# Patient Record
Sex: Female | Born: 2000 | Race: Black or African American | Hispanic: No | Marital: Single | Smoking: Current every day smoker
Health system: Southern US, Community
[De-identification: ages and names within clinical notes are randomized; demographics above are authoritative.]

## PROBLEM LIST (undated history)

## (undated) DIAGNOSIS — Q249 Congenital malformation of heart, unspecified: Secondary | ICD-10-CM

## (undated) DIAGNOSIS — J45909 Unspecified asthma, uncomplicated: Secondary | ICD-10-CM

## (undated) DIAGNOSIS — I4510 Unspecified right bundle-branch block: Secondary | ICD-10-CM

## (undated) DIAGNOSIS — F4010 Social phobia, unspecified: Secondary | ICD-10-CM

## (undated) DIAGNOSIS — E669 Obesity, unspecified: Secondary | ICD-10-CM

## (undated) DIAGNOSIS — I440 Atrioventricular block, first degree: Secondary | ICD-10-CM

## (undated) DIAGNOSIS — F259 Schizoaffective disorder, unspecified: Secondary | ICD-10-CM

## (undated) DIAGNOSIS — R519 Headache, unspecified: Secondary | ICD-10-CM

## (undated) DIAGNOSIS — F410 Panic disorder [episodic paroxysmal anxiety] without agoraphobia: Secondary | ICD-10-CM

## (undated) DIAGNOSIS — F419 Anxiety disorder, unspecified: Secondary | ICD-10-CM

## (undated) DIAGNOSIS — R51 Headache: Secondary | ICD-10-CM

---
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
## 2018-02-04 ENCOUNTER — Other Ambulatory Visit: Payer: Self-pay

## 2018-02-04 ENCOUNTER — Encounter (HOSPITAL_COMMUNITY): Payer: Self-pay

## 2018-02-04 ENCOUNTER — Ambulatory Visit (HOSPITAL_COMMUNITY)
Admission: EM | Admit: 2018-02-04 | Discharge: 2018-02-04 | Disposition: A | Discharge status: Home / Self Care | Payer: Medicaid Other | Attending: Internal Medicine | Admitting: Internal Medicine

## 2018-02-04 DIAGNOSIS — J45909 Unspecified asthma, uncomplicated: Secondary | ICD-10-CM | POA: Diagnosis not present

## 2018-02-04 DIAGNOSIS — F329 Major depressive disorder, single episode, unspecified: Secondary | ICD-10-CM | POA: Diagnosis not present

## 2018-02-04 DIAGNOSIS — Z7951 Long term (current) use of inhaled steroids: Secondary | ICD-10-CM | POA: Diagnosis not present

## 2018-02-04 DIAGNOSIS — Z79899 Other long term (current) drug therapy: Secondary | ICD-10-CM | POA: Diagnosis not present

## 2018-02-04 DIAGNOSIS — I440 Atrioventricular block, first degree: Secondary | ICD-10-CM | POA: Insufficient documentation

## 2018-02-04 DIAGNOSIS — F419 Anxiety disorder, unspecified: Secondary | ICD-10-CM | POA: Insufficient documentation

## 2018-02-04 DIAGNOSIS — Z7984 Long term (current) use of oral hypoglycemic drugs: Secondary | ICD-10-CM | POA: Insufficient documentation

## 2018-02-04 DIAGNOSIS — R102 Pelvic and perineal pain: Secondary | ICD-10-CM | POA: Insufficient documentation

## 2018-02-04 DIAGNOSIS — Z68.41 Body mass index (BMI) pediatric, greater than or equal to 95th percentile for age: Secondary | ICD-10-CM | POA: Insufficient documentation

## 2018-02-04 DIAGNOSIS — R109 Unspecified abdominal pain: Secondary | ICD-10-CM | POA: Diagnosis present

## 2018-02-04 DIAGNOSIS — I4510 Unspecified right bundle-branch block: Secondary | ICD-10-CM | POA: Diagnosis not present

## 2018-02-04 LAB — POCT URINALYSIS DIP (DEVICE)
BILIRUBIN URINE: NEGATIVE
Glucose, UA: NEGATIVE mg/dL
Hgb urine dipstick: NEGATIVE
Ketones, ur: NEGATIVE mg/dL
NITRITE: NEGATIVE
PH: 7 (ref 5.0–8.0)
Protein, ur: NEGATIVE mg/dL
Specific Gravity, Urine: 1.015 (ref 1.005–1.030)
UROBILINOGEN UA: 0.2 mg/dL (ref 0.0–1.0)

## 2018-02-04 LAB — POCT PREGNANCY, URINE: Preg Test, Ur: NEGATIVE

## 2018-02-04 MED ORDER — IBUPROFEN 800 MG PO TABS
ORAL_TABLET | ORAL | Status: AC
  Filled 2018-02-04: qty 1

## 2018-02-04 MED ORDER — IBUPROFEN 800 MG PO TABS: 800.0000 mg | ORAL_TABLET | Freq: Three times a day (TID) | ORAL | 0 refills | Status: DC

## 2018-02-04 MED ORDER — IBUPROFEN 800 MG PO TABS
800.0000 mg | ORAL_TABLET | Freq: Once | ORAL | Status: AC
  Administered 2018-02-04: 800 mg via ORAL

## 2018-02-04 NOTE — Discharge Instructions (Signed)
Continue with ibuprofen every 8 hours as needed for pain.  Drink plenty of fluids and ensure bowel movements are regular.  I have sent your urine as well as vaginal swab to be cultured.  Will notify you of any positive findings and if any changes to treatment are needed.   Please follow up with your primary care provider and/or gynecologist for recheck next week as you may need an ultrasound of the pelvis.  If worsening of pain, fevers, diarrhea, vomiting, or otherwise worsening please return or go to the Er.

## 2018-02-04 NOTE — ED Provider Notes (Signed)
[facility]    CSN: [id number] Arrival date & time: 02/04/18  1411     History   Chief Complaint Chief Complaint  Patient presents with  . Abdominal Pain    HPI Diane Mann is a 17 y.o. female.   Diane Mann presents with complaints of pelvic pain which has been ongoing for months but has worsened recently. Sometimes eating or standing too long can worsen it, otherwise no specific triggers. Comes and goes. No nausea or vomiting. Denies diarrhea or constipation, had a normal BM today. No fevers. LMP 7/1, periods are irregular, she is on birth control for this. She is not sexually active. Denies vaginal itching, burning or discharge. No urinary symptoms. Occasional low back pain. Has intermittently taking ibuprofen which helps for a short amount of time, last was two days ago. Hx of anxiety, asthma, headache, oligomenorrhea.     ROS per HPI.      Past Medical History:  Diagnosis Date  . Anxiety   . Asthma   . Asthma 04/24/2015  . 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 Congenital heart problem    blockage  . Headache   . Obesity   . Panic attacks 04/24/2015  . RBBB (right bundle branch block) 04/24/2015  . Social anxiety disorder 04/24/2015    Patient Active Problem List   Diagnosis Date Noted  . 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 Social anxiety disorder 04/24/2015  . Panic attacks 04/24/2015  . RBBB (right bundle branch block) 04/24/2015  . 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 Asthma 04/24/2015  . Oligomenorrhea 04/09/2015  . Thyroid function test abnormal 08/05/2014  . 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 Obesity 08/05/2014  . Short stature 08/05/2014    Past Surgical History:  Procedure Laterality Date  . MOUTH SURGERY  05/04/2017   Wisdom teeth ectraction Bilateral     OB History    None      Home Medications    Prior to Admission medications   Medication Sig Start Date End Date Taking? Authorizing Provider  albuterol (PROVENTIL HFA;VENTOLIN HFA) 108 (90 Base) MCG/ACT inhaler Inhale into the lungs every 6 (six) hours as needed for wheezing or shortness of breath.   Yes [provider]  metFORMIN (GLUCOPHAGE-XR) 500 MG 24 hr tablet Take 1 tablet (500 mg total) by mouth daily with breakfast. 08/01/17  Yes Dessa PhiBadik, Jennifer, MD  norethindrone-ethinyl estradiol-iron (MICROGESTIN FE 1.5/30) 1.5-30 MG-MCG tablet Take 1 tablet by mouth daily. Skip placebo pills x 2 months. On the third month- take the placebo (brown) pills. 03/30/17  Yes Dessa PhiBadik, Jennifer, MD  brompheniramine-pseudoephedrine-DM 30-2-10 MG/5ML syrup Take 5 mLs by mouth 4 (four) times daily as needed. 12/24/17   Wieters, Hallie C, PA-C  Cetirizine HCl 10 MG CAPS Take 1 capsule (10 mg total) by mouth daily for 10 days. 12/24/17 01/03/18  Wieters, Hallie C, PA-C  fluticasone (FLONASE) 50 MCG/ACT nasal spray Place 1-2 sprays into both nostrils daily for 7 days. 12/24/17 12/31/17  Wieters, Hallie C, PA-C  ibuprofen (ADVIL,MOTRIN) 800 MG tablet Take 1 tablet (800 mg total) by mouth 3 (three) times daily. 02/04/18   Georgetta HaberBurky, Jaydeen Darley B, NP    Family History Family History  Problem Relation Age of Onset  . Lupus Mother   . Heart disease Father     Social History Social History  Tobacco Use  . Smoking status: Never Smoker  . Smokeless tobacco: Never Used  Substance Use Topics  . Alcohol use: No  . Drug use: No     Allergies   Patient has no known allergies.   Review of Systems Review of Systems   Physical Exam Triage Vital Signs ED Triage Vitals  Enc Vitals Group     BP 02/04/18 1520 128/62     Pulse Rate 02/04/18 1520 88     Resp 02/04/18 1520 16     Temp 02/04/18 1520 98.6 F (37 C)     Temp Source 02/04/18 1520 Oral     SpO2 02/04/18 1520 98 %     Weight --      Height --      Head  Circumference --      Peak Flow --      Pain Score 02/04/18 1521 10     Pain Loc --      Pain Edu? --      Excl. in GC? --    No data found.  Updated Vital Signs BP 128/62 (BP Location: Left Arm)   Pulse 88   Temp 98.6 F (37 C) (Oral)   Resp 16   SpO2 98%   Visual Acuity Right Eye Distance:   Left Eye Distance:   Bilateral Distance:    Right Eye Near:   Left Eye Near:    Bilateral Near:     Physical Exam  Constitutional: She is oriented to person, place, and time. She appears well-developed and well-nourished. No distress.  Cardiovascular: Normal rate, regular rhythm and normal heart sounds.  Pulmonary/Chest: Effort normal and breath sounds normal.  Abdominal: Soft. Bowel sounds are normal. There is no hepatosplenomegaly, splenomegaly or hepatomegaly. There is tenderness in the right lower quadrant, suprapubic area and left lower quadrant. There is no rigidity, no rebound, no guarding, no CVA tenderness, no tenderness at McBurney's point and negative Murphy's sign. No hernia. Hernia confirmed negative in the right inguinal area and confirmed negative in the left inguinal area.  Genitourinary: There is no rash or lesion on the right labia. There is no rash or lesion on the left labia. Cervix exhibits no motion tenderness. Right adnexum displays tenderness. Left adnexum displays tenderness. Vaginal discharge found.  Genitourinary Comments: Scant thick white vaginal discharge noted; generally tender at midline and adnexa bilaterally with bimanual exam; no CMT; no bleeding; no masses or enlargement palpated   Lymphadenopathy: No inguinal adenopathy noted on the right or left side.  Neurological: She is alert and oriented to person, place, and time.  Skin: Skin is warm and dry.     UC Treatments / Results  Labs (all labs ordered are listed, but only abnormal results are displayed) Labs Reviewed  POCT URINALYSIS DIP (DEVICE) - Abnormal; Notable for the following components:       Result Value   Leukocytes, UA SMALL (*)    All other components within normal limits  URINE CULTURE  POCT PREGNANCY, URINE  CERVICOVAGINAL ANCILLARY ONLY    EKG None  Radiology No results found.  Procedures Procedures (including critical care time)  Medications Ordered in UC Medications  ibuprofen (ADVIL,MOTRIN) tablet 800 mg (has no administration in time range)    Initial Impression / Assessment and Plan / UC Course  I have reviewed the triage vital signs and the nursing notes.  Pertinent labs & imaging results that were available during my care of the patient were reviewed by me  and considered in my medical decision making (see chart for details).     Non toxic today. Without red flag findings for emergent evaluation but certainly feel that likely a pelvic ultrasound will be needed for further evaluation of her symptoms which have been ongoing for months. Eating and drinking, no fevers. Urine culture pending. Vaginal cytology pending. Return precautions provided. Follow up next week with PCP or gyne. Patient verbalized understanding and agreeable to plan.    Final Clinical Impressions(s) / UC Diagnoses   Final diagnoses:  Pelvic pain in female     Discharge Instructions     Continue with ibuprofen every 8 hours as needed for pain.  Drink plenty of fluids and ensure bowel movements are regular.  I have sent your urine as well as vaginal swab to be cultured.  Will notify you of any positive findings and if any changes to treatment are needed.   Please follow up with your primary care provider and/or gynecologist for recheck next week as you may need an ultrasound of the pelvis.  If worsening of pain, fevers, diarrhea, vomiting, or otherwise worsening please return or go to the Er.    ED Prescriptions    Medication Sig Dispense Auth. Provider   ibuprofen (ADVIL,MOTRIN) 800 MG tablet Take 1 tablet (800 mg total) by mouth 3 (three) times daily. 21 tablet Georgetta Haber, NP     Controlled Substance Prescriptions [employer] Controlled Substance Registry consulted? Not Applicable   Georgetta Haber, NP 02/04/18 [phone]

## 2018-02-04 NOTE — ED Triage Notes (Signed)
Pt presents today with lower abdominal pain that has been going on for a while. States it got worse on 8/8 to the point where she could not walk. States she has urinary frequency.

## 2018-02-05 LAB — URINE CULTURE

## 2018-02-06 LAB — CERVICOVAGINAL ANCILLARY ONLY
BACTERIAL VAGINITIS: NEGATIVE
Candida vaginitis: POSITIVE — AB
Chlamydia: NEGATIVE
NEISSERIA GONORRHEA: NEGATIVE
Trichomonas: NEGATIVE

## 2018-02-07 ENCOUNTER — Telehealth (HOSPITAL_COMMUNITY): Payer: Self-pay

## 2018-02-07 MED ORDER — FLUCONAZOLE 150 MG PO TABS: 150.0000 mg | ORAL_TABLET | Freq: Every day | ORAL | 0 refills | Status: AC

## 2018-02-07 NOTE — Telephone Encounter (Signed)
Candida (yeast) was positive.  Prescription for fluconazole 150mg  po now, repeat dose in 3d if needed, #2 no refills, sent to the pharmacy of record.  Recheck or followup with PCP for further evaluation if symptoms are not improving.  Attempted to reach patient. No answer at this time.

## 2018-03-06 ENCOUNTER — Ambulatory Visit (INDEPENDENT_AMBULATORY_CARE_PROVIDER_SITE_OTHER): Payer: Self-pay | Admitting: Pediatric Endocrinology

## 2018-03-06 ENCOUNTER — Encounter (INDEPENDENT_AMBULATORY_CARE_PROVIDER_SITE_OTHER): Payer: Self-pay | Admitting: Pediatric Endocrinology

## 2018-03-06 VITALS — BP 112/74 | HR 70 | Ht 61.26 in | Wt 193.6 lb

## 2018-03-06 DIAGNOSIS — Z6836 Body mass index (BMI) 36.0-36.9, adult: Secondary | ICD-10-CM

## 2018-03-06 DIAGNOSIS — N913 Primary oligomenorrhea: Secondary | ICD-10-CM

## 2018-03-06 DIAGNOSIS — E6609 Other obesity due to excess calories: Secondary | ICD-10-CM

## 2018-03-06 NOTE — Progress Notes (Signed)
Subjective:  Subjective  Patient Name: Diane Mann Date of Birth: 09/20/2000  MRN: [id number]  Diane Mann  presents to the office today for follow up evaluation and management of her oligomenorrhea, insulin resistance, and pediatric obesity.  HISTORY OF PRESENT ILLNESS:   Diane Mann is a 17 y.o. AA female   Diane Mann was accompanied by her self   1. Diane Mann was seen by her PCP in June of 2015. At that visit they obtained screening labs and were concerned about a low free t4 with a normal TSH. They also were concerned about a possible diagnosis of Turner's syndrome. They referred her to endocrinology for further evaluation and management.    2. [name] was last seen in clinic on 11/03/17. She has been generally healthy.    She says that her knee is better. She has been sick the last 2 days. She didn't go to work today. She was seen by her PCP for lower pelvic pain. It is still hurting.   Her last period was in August. She stopped taking her OCP in July and had a period in August "naturally". She thought that it was really weird. She hasn't gone back on her pills yet- she wants to. She is thinking about starting this month.   She is drinking water. She sometimes has some sweet tea or Dr. Reino Kent.   She is not sure how many jumping jacks she can do. She hasn't been doing them often. She was up to 50.   She continues on metformin once a day.   She usually eats about once a day. She thinks that the Metformin makes her not want to eat as much. She sometimes eats twice a day.   3. Pertinent Review of Systems:  Constitutional: The patient feels "okay I guess". The patient seems healthy and active. She is feeling a little under the weather Eyes: Vision seems to be good. There are no recognized eye problems. Wears glasses  Neck: The patient has no complaints of anterior neck swelling, soreness, tenderness, pressure, discomfort, or difficulty swallowing.   Heart: Heart rate increases with  exercise or other physical activity. The patient has no complaints of palpitations, irregular heart beats, chest pain, or chest pressure.   Lungs: no asthma or wheezing.  Gastrointestinal: Bowel movents seem normal. The patient has no complaints of excessive hunger, acid reflux, upset stomach, stomach aches or pains, diarrhea, or constipation.  Legs: Muscle mass and strength seem normal. There are no complaints of numbness, tingling, burning, or pain. No edema is noted.  Feet: There are no obvious foot problems. There are no complaints of numbness, tingling, burning, or pain. No edema is noted. Neurologic: There are no recognized problems with muscle movement and strength, sensation, or coordination. GYN/GU: per HPI. LMP Aug 2019 Skin: no eczema, birth marks, or rashes.   PAST MEDICAL, FAMILY, AND SOCIAL HISTORY  Past Medical History:  Diagnosis Date  . Anxiety   . Asthma   . Asthma 04/24/2015  . 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 Congenital heart problem    blockage  . Headache   . Obesity   . Panic attacks 04/24/2015  . RBBB (right bundle branch block) 04/24/2015  . Social anxiety disorder 04/24/2015    Family History  Problem Relation Age of Onset  . Lupus Mother   . Heart disease Father      Current Outpatient Medications:  .  albuterol (PROVENTIL HFA;VENTOLIN HFA) 108 (90 Base) MCG/ACT inhaler, Inhale into the lungs every 6 (  six) hours as needed for wheezing or shortness of breath., Disp: , Rfl:  .  metFORMIN (GLUCOPHAGE-XR) 500 MG 24 hr tablet, Take 1 tablet (500 mg total) by mouth daily with breakfast., Disp: 30 tablet, Rfl: 11 .  norethindrone-ethinyl estradiol-iron (MICROGESTIN FE 1.5/30) 1.5-30 MG-MCG tablet, Take 1 tablet by mouth daily. Skip placebo pills x 2 months. On the third month- take the placebo (brown) pills., Disp: 84 tablet, Rfl: 11 .  brompheniramine-pseudoephedrine-DM 30-2-10 MG/5ML syrup, Take 5 mLs by mouth 4 (four) times daily as needed. (Patient not  taking: Reported on 03/06/2018), Disp: 120 mL, Rfl: 0 .  Cetirizine HCl 10 MG CAPS, Take 1 capsule (10 mg total) by mouth daily for 10 days., Disp: 10 capsule, Rfl: 0 .  fluticasone (FLONASE) 50 MCG/ACT nasal spray, Place 1-2 sprays into both nostrils daily for 7 days., Disp: 1 g, Rfl: 0 .  ibuprofen (ADVIL,MOTRIN) 800 MG tablet, Take 1 tablet (800 mg total) by mouth 3 (three) times daily., Disp: 21 tablet, Rfl: 0  Allergies as of 03/06/2018  . (No Known Allergies)     reports that she has never smoked. She has never used smokeless tobacco. She reports that she does not drink alcohol or use drugs. Pediatric History  Patient Guardian Status  . Mother:  Jennika, [place]   Other Topics Concern  . Not on file  Social History Narrative   Diane Mann lives with her mom and stepfather, 46 years old brother.  Mom works in housekeeping.  Diane Mann graduated from [employer]. Works at [employer] on [employer]     1. School and Family: Graduated   Lives with mom and brother Wants to study photography at [employer]. Working at [employer]   2. Activities: not active. Doing photography at church 3. Primary Care Provider: Maree Erie, MD  ROS: There are no other significant problems involving Diane Mann's other body systems.    Objective:  Objective  Vital Signs:  BP 112/74   Pulse 70   Ht 5' 1.26" (1.556 m)   Wt 193 lb 9.6 oz (87.8 kg)   BMI 36.27 kg/m   Blood pressure percentiles are not available for patients who are 18 years or older.   Ht Readings from Last 3 Encounters:  03/06/18 5' 1.26" (1.556 m) (12 %, Z= -1.18)*  11/03/17 5' 0.71" (1.542 m) (8 %, Z= -1.39)*  10/09/17 5\' 1"  (1.549 m) (10 %, Z= -1.28)*   * Growth percentiles are based on CDC (Girls, 2-20 Years) data.   Wt Readings from Last 3 Encounters:  03/06/18 193 lb 9.6 oz (87.8 kg) (97 %, Z= 1.86)*  11/03/17 196 lb 9.6 oz (89.2 kg) (97 %, Z= 1.91)*  10/09/17 202 lb (91.6 kg) (98 %, Z= 1.99)*   * Growth percentiles  are based on CDC (Girls, 2-20 Years) data.   HC Readings from Last 3 Encounters:  No data found for [facility]   Body surface area is 1.95 meters squared. 12 %ile (Z= -1.18) based on CDC (Girls, 2-20 Years) Stature-for-age data based on Stature recorded on 03/06/2018. 97 %ile (Z= 1.86) based on CDC (Girls, 2-20 Years) weight-for-age data using vitals from 03/06/2018.   PHYSICAL EXAM:  Constitutional: The patient appears healthy and well nourished. The patient's height and weight are consistent with obesity for age. She is alert and interactive today. She has lost 3 pounds since her last visit.   Head: The head is normocephalic. Face: The face appears normal. 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  Eyes: The eyes appear to be normally formed and spaced. Gaze is conjugate. There is no obvious arcus or proptosis. Moisture appears normal. Ears: The ears are normally placed and appear externally normal. Mouth: The oropharynx and tongue appear normal. Dentition appears to be normal for age. Oral moisture is normal. Neck: The neck appears to be visibly normal. The thyroid gland is 15 grams in size. The consistency of the thyroid gland is normal. The thyroid gland is not tender to palpation. +1 acanthosis Lungs: The lungs are clear to auscultation. Air movement is good. Heart: Heart rate and rhythm are regular. Heart sounds S1 and S2 are normal. I did not appreciate any pathologic cardiac murmurs. Abdomen: The abdomen appears to be enlarged in size for the patient's age. +stretch marks.  Bowel sounds are normal. There is no obvious hepatomegaly, splenomegaly, or other mass effect.  Trunk is long compared with legs.  Arms: Muscle size and bulk are normal for age. Hands: There is no obvious tremor. Phalangeal and metacarpophalangeal joints are normal. Palmar muscles are normal for age. Palmar skin is normal. Palmar moisture is also normal. Hands are big compared to body.  Legs: Muscles appear normal for age. No edema  is present. Feet: Feet are normally formed. Dorsalis pedal pulses are normal. Neurologic: Strength is normal for age in both the upper and lower extremities. Muscle tone is normal. Sensation to touch is normal in both the legs and feet.   Puberty: Tanner stage pubic hair: V Tanner stage breast/genital V.   LAB DATA:   none  Last A1C 5.3     Assessment and Plan:  Assessment  ASSESSMENT:  Diane Mann is a 17 y.o. AA female with oligomenorrhea and pediatric obesity.   Pediatric Obesity - Has lost ~20 pounds in the past year.  - Has lost 3 pounds since last visit - Taking Metformin 500 mg ER daily with food. She feels that this curbs her appetite - A1C was markedly improved at last visit. Not checked today.   Oligomenorrhea - Has been doing continuous cycling for 3 months at a time on Junel - did not take her OCP this past month and did have a spontaneous cycle - is confused about when to restart pills  PLAN:   1. Diagnostic: A1C at next visit 2. Therapeutic: conitnue  generic Junel fe. Will do continuous cycling with menses q 3 months. Start Sunday after next cycle. Continue Metformin ER 500 mg once daily.  3. Patient education: All of the above discussed with patient. Questions answered.  4. Follow-up: Return in about 4 months (around 07/06/2018).      Dessa Phi, MD  Level of Service: This visit lasted in excess of 25 minutes. More than 50% of the visit was devoted to counseling.

## 2018-03-06 NOTE — Patient Instructions (Addendum)
Continue Metformin 500 ER mg once a day.   Continue Junel for 3 months at a time. Restart the Sunday after your next period. If you do not have a period in September- please call.   Your PCP is the [facility]. You should schedule a follow up there.

## 2018-05-09 ENCOUNTER — Emergency Department (HOSPITAL_COMMUNITY): Payer: No Typology Code available for payment source

## 2018-05-09 ENCOUNTER — Encounter (HOSPITAL_COMMUNITY): Payer: Self-pay | Admitting: Emergency Medicine

## 2018-05-09 ENCOUNTER — Other Ambulatory Visit: Payer: Self-pay

## 2018-05-09 ENCOUNTER — Emergency Department (HOSPITAL_COMMUNITY)
Admission: EM | Admit: 2018-05-09 | Discharge: 2018-05-09 | Disposition: A | Discharge status: Home / Self Care | Payer: No Typology Code available for payment source | Attending: Emergency Medicine | Admitting: Emergency Medicine

## 2018-05-09 DIAGNOSIS — Y990 Civilian activity done for income or pay: Secondary | ICD-10-CM | POA: Insufficient documentation

## 2018-05-09 DIAGNOSIS — Y939 Activity, unspecified: Secondary | ICD-10-CM | POA: Insufficient documentation

## 2018-05-09 DIAGNOSIS — W010XXA Fall on same level from slipping, tripping and stumbling without subsequent striking against object, initial encounter: Secondary | ICD-10-CM | POA: Insufficient documentation

## 2018-05-09 DIAGNOSIS — S8002XA Contusion of left knee, initial encounter: Secondary | ICD-10-CM | POA: Insufficient documentation

## 2018-05-09 DIAGNOSIS — Y929 Unspecified place or not applicable: Secondary | ICD-10-CM | POA: Insufficient documentation

## 2018-05-09 DIAGNOSIS — J45909 Unspecified asthma, uncomplicated: Secondary | ICD-10-CM | POA: Diagnosis not present

## 2018-05-09 DIAGNOSIS — Z79899 Other long term (current) drug therapy: Secondary | ICD-10-CM | POA: Insufficient documentation

## 2018-05-09 MED ORDER — DICLOFENAC SODIUM 1 % TD GEL: 2.0000 g | Freq: Four times a day (QID) | TRANSDERMAL | 0 refills | Status: DC

## 2018-05-09 MED ORDER — ACETAMINOPHEN 325 MG PO TABS
650.0000 mg | ORAL_TABLET | Freq: Once | ORAL | Status: AC
  Administered 2018-05-09: 650 mg via ORAL
  Filled 2018-05-09: qty 2

## 2018-05-09 NOTE — Discharge Instructions (Addendum)
Your x-ray did not have any broken bones. Your symptoms are most likely due to a contusion which is a painful bruise. Take Tylenol and use the Voltaren gel as prescribed to help with pain. Follow the instructions regarding rice therapy. Return to ED if there are additional injuries or falls, worsening swelling, redness of your joint.

## 2018-05-09 NOTE — ED Triage Notes (Signed)
Pt reports she slipped and fell at work today, landed on her L knee. Pt reports pain to L knee. No obvious injury noted.

## 2018-05-09 NOTE — ED Provider Notes (Signed)
[facility] EMERGENCY DEPARTMENT Provider Note   CSN: [id number] Arrival date & time: 05/09/18  4098     History   Chief Complaint Chief Complaint  Patient presents with  . Knee Pain    HPI Diane Mann is a 17 y.o. female who presents to ED for evaluation of left knee pain for the past 1 hour.  States that she slipped on some ice at work and landed directly on her left knee.  She is ambulated with pain since then.  She has a history of a "sprain" in his knee in the past after she injured it similarly.  Denies any prior fracture, dislocation or procedures in the area.  She did not take any medication help with pain.  Denies any other injuries.  HPI  Past Medical History:  Diagnosis Date  . Anxiety   . Asthma   . Asthma 04/24/2015  . 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 Congenital heart problem    blockage  . Headache   . Obesity   . Panic attacks 04/24/2015  . RBBB (right bundle branch block) 04/24/2015  . Social anxiety disorder 04/24/2015    Patient Active Problem List   Diagnosis Date Noted  . 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 Social anxiety disorder 04/24/2015  . Panic attacks 04/24/2015  . RBBB (right bundle branch block) 04/24/2015  . 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 Asthma 04/24/2015  . Oligomenorrhea 04/09/2015  . Thyroid function test abnormal 08/05/2014  . 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 Obesity 08/05/2014  . Short stature 08/05/2014    Past Surgical History:  Procedure Laterality Date  . MOUTH SURGERY  05/04/2017   Wisdom teeth ectraction Bilateral      OB History   None      Home Medications    Prior to Admission medications   Medication Sig Start Date End Date Taking? Authorizing Provider  albuterol (PROVENTIL HFA;VENTOLIN HFA) 108 (90 Base) MCG/ACT  inhaler Inhale into the lungs every 6 (six) hours as needed for wheezing or shortness of breath.    [provider]  brompheniramine-pseudoephedrine-DM 30-2-10 MG/5ML syrup Take 5 mLs by mouth 4 (four) times daily as needed. Patient not taking: Reported on 03/06/2018 12/24/17   Wieters, Fran Lowes C, PA-C  Cetirizine HCl 10 MG CAPS Take 1 capsule (10 mg total) by mouth daily for 10 days. 12/24/17 01/03/18  Wieters, Hallie C, PA-C  diclofenac sodium (VOLTAREN) 1 % GEL Apply 2 g topically 4 (four) times daily. 05/09/18   Vila Dory, PA-C  fluticasone (FLONASE) 50 MCG/ACT nasal spray Place 1-2 sprays into both nostrils daily for 7 days. 12/24/17 12/31/17  Wieters, Hallie C, PA-C  ibuprofen (ADVIL,MOTRIN) 800 MG tablet Take 1 tablet (800 mg total) by mouth 3 (three) times daily. 02/04/18   Georgetta Haber, NP  metFORMIN (GLUCOPHAGE-XR) 500 MG 24 hr tablet Take 1 tablet (500 mg total) by mouth daily with breakfast. 08/01/17   Dessa Phi, MD  norethindrone-ethinyl estradiol-iron (MICROGESTIN FE 1.5/30) 1.5-30 MG-MCG tablet Take 1 tablet by mouth daily. Skip placebo pills x 2 months. On the third month- take the placebo (brown) pills. 03/30/17   Dessa Phi, MD    Family History Family History  Problem Relation Age of Onset  . Lupus Mother   . Heart disease Father     Social  History Social History   Tobacco Use  . Smoking status: Never Smoker  . Smokeless tobacco: Never Used  Substance Use Topics  . Alcohol use: No  . Drug use: No     Allergies   Patient has no known allergies.   Review of Systems Review of Systems  Constitutional: Negative for chills and fever.  Musculoskeletal: Positive for arthralgias. Negative for joint swelling.  Skin: Negative for wound.     Physical Exam Updated Vital Signs BP 128/80   Pulse 91   Temp 99.2 F (37.3 C) (Oral)   Resp 16   LMP 03/22/2017   SpO2 95%   Physical Exam  Constitutional: She appears well-developed and well-nourished. No  distress.  HENT:  Head: Normocephalic and atraumatic.  Eyes: Conjunctivae and EOM are normal. No scleral icterus.  Neck: Normal range of motion.  Pulmonary/Chest: Effort normal. No respiratory distress.  Musculoskeletal: She exhibits tenderness.  Diffuse tenderness to palpation of the left anterior knee.  No deformity, overlying skin changes noted.  Able to flex and extend knee although reports pain with this.  2+ DP pulse palpated bilaterally.  No calf tenderness noted.  Neurological: She is alert.  Skin: No rash noted. She is not diaphoretic.  Psychiatric: She has a normal mood and affect.  Nursing note and vitals reviewed.    ED Treatments / Results  Labs (all labs ordered are listed, but only abnormal results are displayed) Labs Reviewed - No data to display  EKG None  Radiology Dg Knee Complete 4 Views Left  Result Date: 05/09/2018 CLINICAL DATA:  Patient fell at work hurting her knee. EXAM: LEFT KNEE - COMPLETE 4+ VIEW COMPARISON:  10/09/2017 FINDINGS: No evidence of fracture, dislocation, or joint effusion. No evidence of arthropathy or other focal bone abnormality. Soft tissues are unremarkable. IMPRESSION: Negative. Electronically Signed   By: Tollie Eth M.D.   On: 05/09/2018 19:21    Procedures Procedures (including critical care time)  Medications Ordered in ED Medications  acetaminophen (TYLENOL) tablet 650 mg (650 mg Oral Given 05/09/18 1849)     Initial Impression / Assessment and Plan / ED Course  I have reviewed the triage vital signs and the nursing notes.  Pertinent labs & imaging results that were available during my care of the patient were reviewed by me and considered in my medical decision making (see chart for details).     17 year old female presents to ED for left knee pain.  She slipped on ice at work and fell directly onto her knee.  No changes to range of motion or overlying skin changes or wounds noted on my exam.  Area is neurovascularly  intact.  X-rays negative.  Suspect that symptoms are due to contusion.  Doubt infectious or vascular cause of symptoms will advise Voltaren gel, rice therapy and advised to return to ED for any severe worsening symptoms.  Patient is hemodynamically stable, in NAD, and able to ambulate in the ED. Evaluation does not show pathology that would require ongoing emergent intervention or inpatient treatment. I explained the diagnosis to the patient. Pain has been managed and has no complaints prior to discharge. Patient is comfortable with above plan and is stable for discharge at this time. All questions were answered prior to disposition. Strict return precautions for returning to the ED were discussed. Encouraged follow up with PCP.    Portions of this note were generated with Scientist, clinical (histocompatibility and immunogenetics). Dictation errors may occur despite best attempts at proofreading.  Final Clinical Impressions(s) / ED Diagnoses   Final diagnoses:  Contusion of left knee, initial encounter    ED Discharge Orders         Ordered    diclofenac sodium (VOLTAREN) 1 % GEL  4 times daily     05/09/18 1928           Dietrich Pates, PA-C 05/09/18 1931    Doug Sou, MD 05/10/18 [phone]

## 2018-05-09 NOTE — ED Notes (Signed)
Ace wrap placed on pts knee.

## 2018-05-18 ENCOUNTER — Encounter (HOSPITAL_COMMUNITY): Payer: Self-pay | Admitting: Emergency Medicine

## 2018-05-18 ENCOUNTER — Ambulatory Visit (HOSPITAL_COMMUNITY)
Admission: EM | Admit: 2018-05-18 | Discharge: 2018-05-18 | Disposition: A | Discharge status: Home / Self Care | Payer: No Typology Code available for payment source | Attending: Family Medicine | Admitting: Family Medicine

## 2018-05-18 DIAGNOSIS — M25562 Pain in left knee: Secondary | ICD-10-CM | POA: Diagnosis not present

## 2018-05-18 MED ORDER — NAPROXEN 500 MG PO TABS: 500.0000 mg | ORAL_TABLET | Freq: Two times a day (BID) | ORAL | 0 refills | Status: DC

## 2018-05-18 NOTE — ED Triage Notes (Signed)
Pt c/o ongoing L knee pain, pt had xray done in ER which was normal, pt got herself a knee brace which states isn't helping very much.

## 2018-05-18 NOTE — Discharge Instructions (Addendum)
Please follow-up with orthopedics for further evaluation of knee Continue ice and elevation Naprosyn twice daily

## 2018-05-19 NOTE — ED Provider Notes (Signed)
[facility]    CSN: [id number] Arrival date & time: 05/18/18  1820     History   Chief Complaint Chief Complaint  Patient presents with  . Knee Pain    HPI Diane Mann is a 17 y.o. female history of asthma, anxiety, presenting today for evaluation of left knee pain.  Patient states that approximately 1 week and 2 days ago she slipped and fell at work.  Patient works at [employer], landed directly onto her knee.  She was evaluated in the emergency room afterward.  X-rays were negative for bony abnormality.  She was sent home and recommended rest ice elevation and anti-inflammatories.  She has been doing this without relief.  She feels as if her pain is worsened.  She bought an over-the-counter knee brace which is not provided help either.  She does note to have occasional popping sensations and sensation of instability.   HPI  Past Medical History:  Diagnosis Date  . Anxiety   . Asthma   . Asthma 04/24/2015  . 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 Congenital heart problem    blockage  . Headache   . Obesity   . Panic attacks 04/24/2015  . RBBB (right bundle branch block) 04/24/2015  . Social anxiety disorder 04/24/2015    Patient Active Problem List   Diagnosis Date Noted  . 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 Social anxiety disorder 04/24/2015  . Panic attacks 04/24/2015  . RBBB (right bundle branch block) 04/24/2015  . 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 Asthma 04/24/2015  . Oligomenorrhea 04/09/2015  . Thyroid function test abnormal 08/05/2014  . 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 Obesity 08/05/2014  . Short stature 08/05/2014    Past Surgical History:  Procedure Laterality Date  . MOUTH SURGERY  05/04/2017   Wisdom teeth ectraction Bilateral     OB History   None      Home  Medications    Prior to Admission medications   Medication Sig Start Date End Date Taking? Authorizing Provider  albuterol (PROVENTIL HFA;VENTOLIN HFA) 108 (90 Base) MCG/ACT inhaler Inhale into the lungs every 6 (six) hours as needed for wheezing or shortness of breath.    [provider]  Cetirizine HCl 10 MG CAPS Take 1 capsule (10 mg total) by mouth daily for 10 days. 12/24/17 01/03/18  Wieters, Hallie C, PA-C  diclofenac sodium (VOLTAREN) 1 % GEL Apply 2 g topically 4 (four) times daily. 05/09/18   Khatri, Hina, PA-C  fluticasone (FLONASE) 50 MCG/ACT nasal spray Place 1-2 sprays into both nostrils daily for 7 days. 12/24/17 12/31/17  Wieters, Hallie C, PA-C  ibuprofen (ADVIL,MOTRIN) 800 MG tablet Take 1 tablet (800 mg total) by mouth 3 (three) times daily. 02/04/18   Georgetta HaberBurky, Natalie B, NP  naproxen (NAPROSYN) 500 MG tablet Take 1 tablet (500 mg total) by mouth 2 (two) times daily. 05/18/18   Wieters, Hallie C, PA-C  norethindrone-ethinyl estradiol-iron (MICROGESTIN FE 1.5/30) 1.5-30 MG-MCG tablet Take 1 tablet by mouth daily. Skip placebo pills x 2 months. On the third month- take the placebo (brown) pills. 03/30/17   Dessa PhiBadik, Jennifer, MD    Family History Family History  Problem Relation Age of Onset  . Lupus Mother   . Heart disease Father     Social History Social History   Tobacco Use  .  Smoking status: Never Smoker  . Smokeless tobacco: Never Used  Substance Use Topics  . Alcohol use: No  . Drug use: No     Allergies   Patient has no known allergies.   Review of Systems Review of Systems  Constitutional: Negative for fatigue and fever.  Eyes: Negative for visual disturbance.  Respiratory: Negative for shortness of breath.   Cardiovascular: Negative for chest pain.  Gastrointestinal: Negative for abdominal pain, nausea and vomiting.  Musculoskeletal: Positive for arthralgias and joint swelling.  Skin: Positive for color change. Negative for rash and wound.    Neurological: Negative for dizziness, weakness, light-headedness and headaches.     Physical Exam Triage Vital Signs ED Triage Vitals  Enc Vitals Group     BP 05/18/18 1909 123/76     Pulse Rate 05/18/18 1909 84     Resp 05/18/18 1909 16     Temp 05/18/18 1909 98.4 F (36.9 C)     Temp src --      SpO2 05/18/18 1909 100 %     Weight --      Height --      Head Circumference --      Peak Flow --      Pain Score 05/18/18 1911 9     Pain Loc --      Pain Edu? --      Excl. in GC? --    No data found.  Updated Vital Signs BP 123/76   Pulse 84   Temp 98.4 F (36.9 C)   Resp 16   SpO2 100%   Visual Acuity Right Eye Distance:   Left Eye Distance:   Bilateral Distance:    Right Eye Near:   Left Eye Near:    Bilateral Near:     Physical Exam  Constitutional: She is oriented to person, place, and time. She appears well-developed and well-nourished.  No acute distress  HENT:  Head: Normocephalic and atraumatic.  Nose: Nose normal.  Eyes: Conjunctivae are normal.  Neck: Neck supple.  Cardiovascular: Normal rate.  Pulmonary/Chest: Effort normal. No respiratory distress.  Abdominal: She exhibits no distension.  Musculoskeletal: Normal range of motion.  Left knee:, Slight discoloration of bruising/erythema to anterior/inferior aspect of knee, tender to palpation overlying this area as well as medial and lateral joint line, full active range of motion, resisting full extension, but is able to fully extend, no varus or valgus laxity appreciated, negative Lachman's.  Negative McMurray's.  Neurological: She is alert and oriented to person, place, and time.  Skin: Skin is warm and dry.  Psychiatric: She has a normal mood and affect.  Nursing note and vitals reviewed.    UC Treatments / Results  Labs (all labs ordered are listed, but only abnormal results are displayed) Labs Reviewed - No data to display  EKG None  Radiology No results  found.  Procedures Procedures (including critical care time)  Medications Ordered in UC Medications - No data to display  Initial Impression / Assessment and Plan / UC Course  I have reviewed the triage vital signs and the nursing notes.  Pertinent labs & imaging results that were available during my care of the patient were reviewed by me and considered in my medical decision making (see chart for details).     Patient with persistent knee pain since injury.  No new injury since.  Do not recommend further imaging today.  Discussed possibility of other injuries including meniscal versus ligamentous.  Recommended patient  continuing conservative treatment of ice, elevation, provided alternative knee brace.  Provided patient with options and opted for a knee wrap with metal hinges on the side.  Advised to follow-up with orthopedics if symptoms still persisting and not improving with another week of conservative treatment.Discussed strict return precautions. Patient verbalized understanding and is agreeable with plan.  Final Clinical Impressions(s) / UC Diagnoses   Final diagnoses:  Acute pain of left knee     Discharge Instructions     Please follow-up with orthopedics for further evaluation of knee Continue ice and elevation Naprosyn twice daily   ED Prescriptions    Medication Sig Dispense Auth. Provider   naproxen (NAPROSYN) 500 MG tablet Take 1 tablet (500 mg total) by mouth 2 (two) times daily. 30 tablet Wieters, Pocahontas C, PA-C     Controlled Substance Prescriptions [place] Controlled Substance Registry consulted? Not Applicable   Lew Dawes, [place] 05/19/18 [phone]

## 2018-07-10 ENCOUNTER — Encounter (INDEPENDENT_AMBULATORY_CARE_PROVIDER_SITE_OTHER): Payer: Self-pay | Admitting: Pediatric Endocrinology

## 2018-07-10 ENCOUNTER — Ambulatory Visit (INDEPENDENT_AMBULATORY_CARE_PROVIDER_SITE_OTHER): Payer: Medicaid Other | Admitting: Pediatric Endocrinology

## 2018-07-10 VITALS — BP 114/70 | HR 88 | Ht 61.0 in | Wt 189.2 lb

## 2018-07-10 DIAGNOSIS — N914 Secondary oligomenorrhea: Secondary | ICD-10-CM | POA: Diagnosis not present

## 2018-07-10 DIAGNOSIS — Z68.41 Body mass index (BMI) pediatric, greater than or equal to 95th percentile for age: Secondary | ICD-10-CM

## 2018-07-10 LAB — POCT GLYCOSYLATED HEMOGLOBIN (HGB A1C): Hemoglobin A1C: 5.3 % (ref 4.0–5.6)

## 2018-07-10 LAB — POCT GLUCOSE (DEVICE FOR HOME USE): Glucose Fasting, POC: 79 mg/dL (ref 70–99)

## 2018-07-10 MED ORDER — NORETHIN ACE-ETH ESTRAD-FE 1.5-30 MG-MCG PO TABS: 1.0000 | ORAL_TABLET | Freq: Every day | ORAL | 11 refills | Status: DC

## 2018-07-10 NOTE — Patient Instructions (Signed)
Continue to avoid high concentration sweets like soda and sweet tea!  OK to stop Metformin.  Please Restart Junel (birth control) This Sunday.

## 2018-07-10 NOTE — Progress Notes (Signed)
Subjective:  Subjective  Patient Name: Diane BristolBrittany Roulhac Date of Birth: 09/01/2000  MRN: [id number]  Diane Mann  presents to the office today for follow up evaluation and management of her oligomenorrhea, insulin resistance, and pediatric obesity.  HISTORY OF PRESENT ILLNESS:   Diane Mann is a 18 y.o. AA female   Diane Mann was accompanied by her self    1. Diane Mann was seen by her PCP in June of 2015. At that visit they obtained screening labs and were concerned about a low free t4 with a normal TSH. They also were concerned about a possible diagnosis of Turner's syndrome. They referred her to endocrinology for further evaluation and management.    2. Diane Mann was last seen in clinic on 03/06/18. She has been generally healthy.    After her last visit she was meant to restart her OCP but she did not get the medication from the pharmacy. She says that she has it now and is planning to start this weekend.   She has not had a period since August. She remembers that she is meant to have one every 3 months.   She is drinking water. She is not drinking Sweet Tea. She is no longer drinking Dr. Reino KentPepper- it is "too syrupy" for her now.   She is not currently exercising. She would like to be walking.   She continues on metformin once a day.   She is eating one large meal a day.   3. Pertinent Review of Systems:  Constitutional: The patient feels "sleepy". The patient seems healthy and active. She is tired today Eyes: Vision seems to be good. There are no recognized eye problems. Wears glasses  Neck: The patient has no complaints of anterior neck swelling, soreness, tenderness, pressure, discomfort, or difficulty swallowing.   Heart: Heart rate increases with exercise or other physical activity. The patient has no complaints of palpitations, irregular heart beats, chest pain, or chest pressure.   Lungs: no asthma or wheezing.  Gastrointestinal: Bowel movents seem normal. The patient has no  complaints of excessive hunger, acid reflux, upset stomach, stomach aches or pains, diarrhea, or constipation.  Legs: Muscle mass and strength seem normal. There are no complaints of numbness, tingling, burning, or pain. No edema is noted.  Feet: There are no obvious foot problems. There are no complaints of numbness, tingling, burning, or pain. No edema is noted. Neurologic: There are no recognized problems with muscle movement and strength, sensation, or coordination. GYN/GU: per HPI. LMP Aug 2019 - none since.  Skin: no eczema, birth marks, or rashes.   PAST MEDICAL, FAMILY, AND SOCIAL HISTORY  Past Medical History:  Diagnosis Date  . Anxiety   . Asthma   . Asthma 04/24/2015  . 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 Congenital heart problem    blockage  . Headache   . Obesity   . Panic attacks 04/24/2015  . RBBB (right bundle branch block) 04/24/2015  . Social anxiety disorder 04/24/2015    Family History  Problem Relation Age of Onset  . Lupus Mother   . Heart disease Father      Current Outpatient Medications:  .  albuterol (PROVENTIL HFA;VENTOLIN HFA) 108 (90 Base) MCG/ACT inhaler, Inhale into the lungs every 6 (six) hours as needed for wheezing or shortness of breath., Disp: , Rfl:  .  norethindrone-ethinyl estradiol-iron (MICROGESTIN FE 1.5/30) 1.5-30 MG-MCG tablet, Take 1 tablet by mouth daily. Skip placebo pills x 2 months. On the third month- take the placebo (brown)  pills., Disp: 84 tablet, Rfl: 11 .  Cetirizine HCl 10 MG CAPS, Take 1 capsule (10 mg total) by mouth daily for 10 days., Disp: 10 capsule, Rfl: 0 .  diclofenac sodium (VOLTAREN) 1 % GEL, Apply 2 g topically 4 (four) times daily. (Patient not taking: Reported on 07/10/2018), Disp: 1 Tube, Rfl: 0 .  fluticasone (FLONASE) 50 MCG/ACT nasal spray, Place 1-2 sprays into both nostrils daily for 7 days., Disp: 1 g, Rfl: 0 .  ibuprofen (ADVIL,MOTRIN) 800 MG tablet, Take 1 tablet (800 mg total) by mouth 3 (three) times  daily. (Patient not taking: Reported on 07/10/2018), Disp: 21 tablet, Rfl: 0 .  naproxen (NAPROSYN) 500 MG tablet, Take 1 tablet (500 mg total) by mouth 2 (two) times daily. (Patient not taking: Reported on 07/10/2018), Disp: 30 tablet, Rfl: 0  Allergies as of 07/10/2018  . (No Known Allergies)     reports that she has never smoked. She has never used smokeless tobacco. She reports that she does not drink alcohol or use drugs. Pediatric History  Patient Parents  . Petrelli,Carla (Mother)   Other Topics Concern  . Not on file  Social History Narrative   Diane Mann lives with her mom and stepfather, 57 years old brother.  Mom works in housekeeping.  Diane Mann graduated from [employer]. Works at [employer] on [employer]     1. School and Family: Graduated   Lives with mom and brother Wants to study photography at [employer]. Working at [employer]   2. Activities: not active. Doing photography at church 3. Primary Care Provider: Maree Erie, MD  ROS: There are no other significant problems involving Chavon's other body systems.    Objective:  Objective  Vital Signs:  BP 114/70   Pulse 88   Ht 5\' 1"  (1.549 m)   Wt 189 lb 3.2 oz (85.8 kg)   BMI 35.75 kg/m   Blood pressure percentiles are not available for patients who are 18 years or older.  Ht Readings from Last 3 Encounters:  07/10/18 5\' 1"  (1.549 m) (10 %, Z= -1.29)*  03/06/18 5' 1.26" (1.556 m) (12 %, Z= -1.18)*  11/03/17 5' 0.71" (1.542 m) (8 %, Z= -1.39)*   * Growth percentiles are based on CDC (Girls, 2-20 Years) data.   Wt Readings from Last 3 Encounters:  07/10/18 189 lb 3.2 oz (85.8 kg) (96 %, Z= 1.78)*  03/06/18 193 lb 9.6 oz (87.8 kg) (97 %, Z= 1.86)*  11/03/17 196 lb 9.6 oz (89.2 kg) (97 %, Z= 1.91)*   * Growth percentiles are based on CDC (Girls, 2-20 Years) data.   HC Readings from Last 3 Encounters:  No data found for [facility]   Body surface area is 1.92 meters squared. 10 %ile (Z= -1.29) based on  CDC (Girls, 2-20 Years) Stature-for-age data based on Stature recorded on 07/10/2018. 96 %ile (Z= 1.78) based on CDC (Girls, 2-20 Years) weight-for-age data using vitals from 07/10/2018.   PHYSICAL EXAM:  Constitutional: The patient appears healthy and well nourished. The patient's height and weight are consistent with obesity for age. She is sleepy. She has lost 4 pounds since her last visit.   Head: The head is normocephalic. Face: The face appears normal. 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  Eyes: The eyes appear to be normally formed and spaced. Gaze is conjugate. There is no obvious arcus or proptosis. Moisture appears normal. Ears: The ears are normally placed and appear externally normal. Mouth: The oropharynx and tongue  appear normal. Dentition appears to be normal for age. Oral moisture is normal. Neck: The neck appears to be visibly normal. The thyroid gland is 15 grams in size. The consistency of the thyroid gland is normal. The thyroid gland is not tender to palpation. +1 acanthosis Lungs: The lungs are clear to auscultation. Air movement is good. Heart: Heart rate and rhythm are regular. Heart sounds S1 and S2 are normal. I did not appreciate any pathologic cardiac murmurs. Abdomen: The abdomen appears to be enlarged in size for the patient's age. +stretch marks.  Bowel sounds are normal. There is no obvious hepatomegaly, splenomegaly, or other mass effect.  Trunk is long compared with legs.  Arms: Muscle size and bulk are normal for age. Hands: There is no obvious tremor. Phalangeal and metacarpophalangeal joints are normal. Palmar muscles are normal for age. Palmar skin is normal. Palmar moisture is also normal. Hands are big compared to body.  Legs: Muscles appear normal for age. No edema is present. Feet: Feet are normally formed. Dorsalis pedal pulses are normal. Neurologic: Strength is normal for age in both the upper and lower extremities. Muscle tone is normal. Sensation to  touch is normal in both the legs and feet.   Puberty: Tanner stage pubic hair: V Tanner stage breast/genital V.   LAB DATA:   Results for orders placed or performed in visit on 07/10/18  POCT Glucose (Device for Home Use)  Result Value Ref Range   Glucose Fasting, POC 79 70 - 99 mg/dL   POC Glucose    POCT glycosylated hemoglobin (Hb A1C)  Result Value Ref Range   Hemoglobin A1C 5.3 4.0 - 5.6 %   HbA1c POC (<> result, manual entry)     HbA1c, POC (prediabetic range)     HbA1c, POC (controlled diabetic range)       Last A1C 5.3     Assessment and Plan:  Assessment  ASSESSMENT:  Diane Mann is a 18 y.o. AA female with oligomenorrhea and pediatric obesity.    Pediatric Obesity - Has lost ~4 pounds since last visit (1 pound per month) - Taking Metformin 500 mg ER daily with food. She feels that this curbs her appetite - A1C has continued in normal range - Will try stopping Metformin.   Oligomenorrhea - Had been doing continuous cycling for 3 months at a time on Junel - did not take her OCP since before her last visit - Will restart this weekend.   PLAN:   1. Diagnostic: A1C at next visit  2. Therapeutic: Restart generic Junel fe. Will do continuous cycling with menses q 3 months. Start Sunday. Discontinue Metformin ER 500 mg once daily.  3. Patient education: All of the above discussed with patient. Questions answered.  4. Follow-up: Return in about 3 months (around 10/09/2018).      Dessa PhiJennifer Jarielys Girardot, MD  Level of Service: This visit lasted in excess of 25 minutes. More than 50% of the visit was devoted to counseling.

## 2018-08-19 ENCOUNTER — Emergency Department (HOSPITAL_COMMUNITY): Payer: Medicaid Other

## 2018-08-19 ENCOUNTER — Encounter (HOSPITAL_COMMUNITY): Payer: Self-pay | Admitting: Emergency Medicine

## 2018-08-19 ENCOUNTER — Inpatient Hospital Stay (HOSPITAL_COMMUNITY)
Admission: AD | Admit: 2018-08-19 | Discharge: 2018-08-24 | DRG: 885 | Disposition: A | Discharge status: Home / Self Care | Payer: Medicaid Other | Source: Intra-hospital | Attending: Psychiatry | Admitting: Psychiatry

## 2018-08-19 ENCOUNTER — Ambulatory Visit (HOSPITAL_COMMUNITY)
Admission: RE | Admit: 2018-08-19 | Discharge: 2018-08-19 | Disposition: A | Discharge status: Home / Self Care | Payer: Medicaid Other | Attending: Psychiatry | Admitting: Psychiatry

## 2018-08-19 ENCOUNTER — Other Ambulatory Visit: Payer: Self-pay

## 2018-08-19 ENCOUNTER — Emergency Department (HOSPITAL_COMMUNITY)
Admission: EM | Admit: 2018-08-19 | Discharge: 2018-08-19 | Disposition: A | Discharge status: Other Acute Inpatient Hospital | Payer: Medicaid Other | Attending: Emergency Medicine | Admitting: Emergency Medicine

## 2018-08-19 ENCOUNTER — Encounter (HOSPITAL_COMMUNITY): Payer: Self-pay

## 2018-08-19 DIAGNOSIS — F4010 Social phobia, unspecified: Secondary | ICD-10-CM | POA: Diagnosis present

## 2018-08-19 DIAGNOSIS — Z915 Personal history of self-harm: Secondary | ICD-10-CM

## 2018-08-19 DIAGNOSIS — F649 Gender identity disorder, unspecified: Secondary | ICD-10-CM | POA: Diagnosis present

## 2018-08-19 DIAGNOSIS — J45909 Unspecified asthma, uncomplicated: Secondary | ICD-10-CM | POA: Insufficient documentation

## 2018-08-19 DIAGNOSIS — I4510 Unspecified right bundle-branch block: Secondary | ICD-10-CM | POA: Diagnosis present

## 2018-08-19 DIAGNOSIS — F410 Panic disorder [episodic paroxysmal anxiety] without agoraphobia: Secondary | ICD-10-CM | POA: Diagnosis present

## 2018-08-19 DIAGNOSIS — F332 Major depressive disorder, recurrent severe without psychotic features: Principal | ICD-10-CM | POA: Diagnosis present

## 2018-08-19 DIAGNOSIS — F419 Anxiety disorder, unspecified: Secondary | ICD-10-CM | POA: Insufficient documentation

## 2018-08-19 DIAGNOSIS — Z68.41 Body mass index (BMI) pediatric, greater than or equal to 95th percentile for age: Secondary | ICD-10-CM

## 2018-08-19 DIAGNOSIS — Z79899 Other long term (current) drug therapy: Secondary | ICD-10-CM

## 2018-08-19 DIAGNOSIS — G4700 Insomnia, unspecified: Secondary | ICD-10-CM | POA: Diagnosis present

## 2018-08-19 DIAGNOSIS — E669 Obesity, unspecified: Secondary | ICD-10-CM | POA: Diagnosis present

## 2018-08-19 DIAGNOSIS — Z791 Long term (current) use of non-steroidal anti-inflammatories (NSAID): Secondary | ICD-10-CM

## 2018-08-19 DIAGNOSIS — R112 Nausea with vomiting, unspecified: Secondary | ICD-10-CM | POA: Diagnosis present

## 2018-08-19 DIAGNOSIS — F4310 Post-traumatic stress disorder, unspecified: Secondary | ICD-10-CM | POA: Diagnosis present

## 2018-08-19 DIAGNOSIS — Z832 Family history of diseases of the blood and blood-forming organs and certain disorders involving the immune mechanism: Secondary | ICD-10-CM

## 2018-08-19 DIAGNOSIS — F4000 Agoraphobia, unspecified: Secondary | ICD-10-CM | POA: Diagnosis present

## 2018-08-19 DIAGNOSIS — Z818 Family history of other mental and behavioral disorders: Secondary | ICD-10-CM

## 2018-08-19 DIAGNOSIS — R45851 Suicidal ideations: Secondary | ICD-10-CM | POA: Diagnosis present

## 2018-08-19 DIAGNOSIS — Z8249 Family history of ischemic heart disease and other diseases of the circulatory system: Secondary | ICD-10-CM

## 2018-08-19 DIAGNOSIS — F333 Major depressive disorder, recurrent, severe with psychotic symptoms: Secondary | ICD-10-CM | POA: Diagnosis not present

## 2018-08-19 LAB — URINALYSIS, ROUTINE W REFLEX MICROSCOPIC
Bilirubin Urine: NEGATIVE
Glucose, UA: NEGATIVE mg/dL
Hgb urine dipstick: NEGATIVE
Ketones, ur: NEGATIVE mg/dL
Leukocytes,Ua: NEGATIVE
Nitrite: NEGATIVE
Protein, ur: NEGATIVE mg/dL
Specific Gravity, Urine: 1.023 (ref 1.005–1.030)
pH: 6 (ref 5.0–8.0)

## 2018-08-19 LAB — BASIC METABOLIC PANEL
Anion gap: 9 (ref 5–15)
BUN: 13 mg/dL (ref 6–20)
CO2: 25 mmol/L (ref 22–32)
Calcium: 9.3 mg/dL (ref 8.9–10.3)
Chloride: 104 mmol/L (ref 98–111)
Creatinine, Ser: 0.79 mg/dL (ref 0.44–1.00)
GFR calc Af Amer: >60 mL/min (ref >60)
GFR calc non Af Amer: >60 mL/min (ref >60)
Glucose, Bld: 84 mg/dL (ref 70–99)
Potassium: 3.8 mmol/L (ref 3.5–5.1)
Sodium: 138 mmol/L (ref 135–145)

## 2018-08-19 LAB — RAPID URINE DRUG SCREEN, HOSP PERFORMED
Amphetamines: NOT DETECTED
Barbiturates: NOT DETECTED
Benzodiazepines: NOT DETECTED
Cocaine: NOT DETECTED
Opiates: NOT DETECTED
Tetrahydrocannabinol: NOT DETECTED

## 2018-08-19 LAB — CBC WITH DIFFERENTIAL/PLATELET
Abs Immature Granulocytes: 0.01 10*3/uL (ref 0.00–0.07)
Basophils Absolute: 0.1 10*3/uL (ref 0.0–0.1)
Basophils Relative: 1 %
Eosinophils Absolute: 0 10*3/uL (ref 0.0–0.5)
Eosinophils Relative: 1 %
HCT: 36.8 % (ref 36.0–46.0)
Hemoglobin: 11.4 g/dL — ABNORMAL LOW (ref 12.0–15.0)
Immature Granulocytes: 0 %
Lymphocytes Relative: 37 %
Lymphs Abs: 2.6 10*3/uL (ref 0.7–4.0)
MCH: 31.7 pg (ref 26.0–34.0)
MCHC: 31 g/dL (ref 30.0–36.0)
MCV: 102.2 fL — ABNORMAL HIGH (ref 80.0–100.0)
Monocytes Absolute: 0.4 10*3/uL (ref 0.1–1.0)
Monocytes Relative: 5 %
Neutro Abs: 3.9 10*3/uL (ref 1.7–7.7)
Neutrophils Relative %: 56 %
Platelets: 250 10*3/uL (ref 150–400)
RBC: 3.6 MIL/uL — ABNORMAL LOW (ref 3.87–5.11)
RDW: 12.8 % (ref 11.5–15.5)
WBC: 7 10*3/uL (ref 4.0–10.5)
nRBC: 0 % (ref 0.0–0.2)

## 2018-08-19 LAB — PREGNANCY, URINE: Preg Test, Ur: NEGATIVE

## 2018-08-19 MED ORDER — TRAZODONE HCL 50 MG PO TABS
50.0000 mg | ORAL_TABLET | Freq: Every evening | ORAL | Status: DC | PRN
  Administered 2018-08-19: 50 mg via ORAL
  Filled 2018-08-19: qty 1

## 2018-08-19 MED ORDER — ALUM & MAG HYDROXIDE-SIMETH 200-200-20 MG/5ML PO SUSP: 30.0000 mL | ORAL | Status: DC | PRN

## 2018-08-19 MED ORDER — NORETHIN ACE-ETH ESTRAD-FE 1.5-30 MG-MCG PO TABS: 1.0000 | ORAL_TABLET | Freq: Every day | ORAL | Status: DC

## 2018-08-19 MED ORDER — MAGNESIUM HYDROXIDE 400 MG/5ML PO SUSP: 30.0000 mL | Freq: Every day | ORAL | Status: DC | PRN

## 2018-08-19 MED ORDER — ACETAMINOPHEN 325 MG PO TABS
650.0000 mg | ORAL_TABLET | Freq: Four times a day (QID) | ORAL | Status: DC | PRN
  Administered 2018-08-20: 650 mg via ORAL
  Filled 2018-08-19: qty 2

## 2018-08-19 NOTE — BH Assessment (Signed)
[facility] Assessment Progress Note   TTS attempted to contact Charge Nurse Darl Pikes at [facility] to advise that patient was coming over for medical clearance because of complaints of chest pain.  Charge Nurse was tied up and another nurse took the report, but TTS Counselor could not understand what her name was after questioning her twice.  Patient has a room assignment at [facility] 404-1 once medically cleared.

## 2018-08-19 NOTE — H&P (Addendum)
Behavioral Health Medical Screening Exam  Diane Mann is an 18 y.o. female.  Presents to behavioral health walking with suicidal ideations patient reports a history of injures behaviors patient reports worsening depression due to multiple family stressors and recent anxiety/panic attack.  Denies that she is taking medications daily.  Patient presents with chest pain 8 out of 10 with 10 being the worst.  Reports her last cardiology urology visit was 6 months prior.  Support encouragement reassurance was provided.  Total Time spent with patient: 15 minutes  Psychiatric Specialty Exam: Physical Exam  Vitals reviewed. Constitutional: She appears well-developed.  Psychiatric: She has a normal mood and affect. Her behavior is normal.    Review of Systems  Psychiatric/Behavioral: Positive for depression and suicidal ideas. The patient is nervous/anxious.   All other systems reviewed and are negative.   Blood pressure 129/63, pulse 99, temperature 99.2 F (37.3 C), temperature source Oral, resp. rate 18, SpO2 99 %.There is no height or weight on file to calculate BMI.  General Appearance: Casual  Eye Contact:  Fair  Speech:  Clear and Coherent  Volume:  Normal  Mood:  Depressed  Affect:  Depressed and Flat  Thought Process:  Coherent  Orientation:  Full (Time, Place, and Person)  Thought Content:  Logical and Rumination  Suicidal Thoughts:  Yes.  without intent/plan  Homicidal Thoughts:  No  Memory:  Immediate;   Fair Recent;   Fair Remote;   Fair  Judgement:  Fair  Insight:  Fair  Psychomotor Activity:  Normal  Concentration: Concentration: Fair  Recall:  [employer] of Knowledge:Fair  Language: Fair  Akathisia:  No  Handed:  Right  AIMS (if indicated):     Assets:  Communication Skills Resilience Social Support  Sleep:       Musculoskeletal: Strength & Muscle Tone: within normal limits Gait & Station: normal Patient leans: N/A  Blood pressure 129/63, pulse 99,  temperature 99.2 F (37.3 C), temperature source Oral, resp. rate 18, SpO2 99 %.  Recommendations: Inpatient recommendation Based on my evaluation the patient appears to have an emergency medical condition for which I recommend the patient be transferred to the emergency department for further evaluation.  Oneta Rack, NP 08/19/2018, 3:01 PM   Agree with NP Assessment

## 2018-08-19 NOTE — ED Notes (Signed)
UNSUCCESSFUL LAB COLLECTION ATTEMPT 

## 2018-08-19 NOTE — ED Notes (Signed)
[facility] called 

## 2018-08-19 NOTE — BH Assessment (Signed)
Tele Assessment Note   Patient Name: Diane Mann MRN: [id number] Referring Physician:  Location of Patient:  Location of Provider: [facility]  Diane Mann is an 18 y.o. female who presents voluntarily to [facility] as a walk-in. Pt states she is trans female and would like to be called "Diane Mann". Pt states he has a history of depression and anxiety for the past three years. Pt denies SI/HI. Pt states he does hears voices from time to time. Pt states the voices tell him "  You are worthless, you are a piece of crap". Pt acknowledges symptoms including daily crying spells, social withdrawal, loss of interest in usual pleasures, decreased concentration, decreased appetite, feelings of shame, worthlessness and irritability. Pt denies any recent manic symptoms. Pt reports two previous suicide attempts, one in 2016 by attempting to overdose on pills and the other last year by attempting to cut himself with a knife. Pt also states he started cutting her arms 3 years ago. Pt denies any history of violence. Pt denies any use of drugs or alcohol.   Pt identifies his primary stressor as family issues and argument with girlfriend/friends. Pt reports that he lives with his mother and states that he does not have any family supports. Pt reports that his dad has a history of MH, however doesn't know the exact diagnosis because he doesn't see him a lot. Pt denied any previous history of family suicide. Pt denies any history of abuse or trauma. Pt denies any current legal problems. Pt states he is currently started receiving talk therapy from a Gender therapist approximately 2-3 weeks ago. Pt reports one previous inpatient hospitalization in 2016 at [facility] for depression and anxiety.   Pt is alert, alert, oriented X3 with normal speech. Eye contact is good and pt is pleasant. Pt's mood is depressed and affect is anxious. Thought process is coherent and relevant. Pt's insight and  judgement is poor.. There is no indication pt is currently responding to internal stimuli. Pt was cooperative throughout assessment. He says he is willing to sign voluntarily into a psychiatric facility.   Disposition: Diane Mann Diane Mann, at [facility], confirmed bed availability. Gave clinical report to Diane Jacks, NP who said pt meets criteria for inpatient psychiatric treatment and accepted pt to the service of Diane Mann, room 404-1.    Diagnosis:F33.2 Major depressive disorder, Recurrent episode, Severe  Past Medical History:  Past Medical History:  Diagnosis Date  . Anxiety   . Asthma   . Asthma 04/24/2015  . 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 Congenital heart problem    blockage  . Headache   . Obesity   . Panic attacks 04/24/2015  . RBBB (right bundle branch block) 04/24/2015  . Social anxiety disorder 04/24/2015    Past Surgical History:  Procedure Laterality Date  . MOUTH SURGERY  05/04/2017   Wisdom teeth ectraction Bilateral     Family History:  Family History  Problem Relation Age of Onset  . Lupus Mother   . Heart disease Father     Social History:  reports that she has never smoked. She has never used smokeless tobacco. She reports that she does not drink alcohol or use drugs.  Additional Social History:     CIWA:   COWS:    Allergies: No Known Allergies  Home Medications: (Not in a Mann admission)   OB/GYN Status:  No LMP recorded. (Menstrual status: Oral contraceptives).  Disposition:     This service was provided via telemedicine using a 2-way, interactive audio and video technology.  Diane Mann, Diane Mann, Diane Mann   Therapeutic Triage Specialist  [phone]  Diane Mann 08/19/2018 3:23 PM

## 2018-08-19 NOTE — ED Provider Notes (Signed)
[facility]-EMERGENCY DEPT Provider Note   CSN: [id number] Arrival date & time: 08/19/18  1516    History   Chief Complaint Chief Complaint  Patient presents with  . Chest Pain  . Medical Clearance    HPI Diane Mann is a 18 y.o. adult.     HPI Patient presents to the emergency department with chest pain is been ongoing for quite a while.  The patient states that he also has being seen for psychiatric issues as well.  Patient states that she has been seen many times for her chest pains.  Patient states she does not have any other complaints.  The patient denies shortness of breath, headache,blurred vision, neck pain, fever, cough, weakness, numbness, dizziness, anorexia, edema, abdominal pain, nausea, vomiting, diarrhea, rash, back pain, dysuria, hematemesis, bloody stool, near syncope, or syncope. Past Medical History:  Diagnosis Date  . Anxiety   . Asthma   . Asthma 04/24/2015  . 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 Congenital heart problem    blockage  . Headache   . Obesity   . Panic attacks 04/24/2015  . RBBB (right bundle branch block) 04/24/2015  . Social anxiety disorder 04/24/2015    Patient Active Problem List   Diagnosis Date Noted  . Major depressive disorder, recurrent severe without psychotic features (HCC) 08/19/2018  . 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 Social anxiety disorder 04/24/2015  . Panic attacks 04/24/2015  . RBBB (right bundle branch block) 04/24/2015  . 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 Asthma 04/24/2015  . Oligomenorrhea 04/09/2015  . Thyroid function test abnormal 08/05/2014  . 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 Obesity 08/05/2014  . Short stature 08/05/2014    Past Surgical History:  Procedure Laterality Date  . MOUTH SURGERY  05/04/2017   Wisdom teeth ectraction Bilateral      OB History   No obstetric history on file.       Home Medications    Prior to Admission medications   Medication Sig Start Date End Date Taking? Authorizing Provider  albuterol (PROVENTIL HFA;VENTOLIN HFA) 108 (90 Base) MCG/ACT inhaler Inhale into the lungs every 6 (six) hours as needed for wheezing or shortness of breath.    [provider]  Cetirizine HCl 10 MG CAPS Take 1 capsule (10 mg total) by mouth daily for 10 days. 12/24/17 01/03/18  Wieters, Hallie C, PA-C  diclofenac sodium (VOLTAREN) 1 % GEL Apply 2 g topically 4 (four) times daily. Patient not taking: Reported on 07/10/2018 05/09/18   Dietrich Pates, PA-C  fluticasone (FLONASE) 50 MCG/ACT nasal spray Place 1-2 sprays into both nostrils daily for 7 days. 12/24/17 12/31/17  Wieters, Hallie C, PA-C  ibuprofen (ADVIL,MOTRIN) 800 MG tablet Take 1 tablet (800 mg total) by mouth 3 (three) times daily. Patient not taking: Reported on 07/10/2018 02/04/18   Linus Mako B, NP  naproxen (NAPROSYN) 500 MG tablet Take 1 tablet (500 mg total) by mouth 2 (two) times daily. Patient not taking: Reported on 07/10/2018 05/18/18   Wieters, [employer] C, PA-C  norethindrone-ethinyl estradiol-iron (MICROGESTIN FE 1.5/30) 1.5-30 MG-MCG tablet Take 1 tablet by mouth daily. Skip placebo pills x 2 months. On the third month- take the placebo (brown) pills. 07/10/18   Dessa Phi, MD    Family History Family History  Problem Relation Age of Onset  . Lupus Mother   . Heart disease Father  Social History Social History   Tobacco Use  . Smoking status: Never Smoker  . Smokeless tobacco: Never Used  Substance Use Topics  . Alcohol use: No  . Drug use: No     Allergies   Patient has no known allergies.   Review of Systems Review of Systems All other systems negative except as documented in the HPI. All pertinent positives and negatives as reviewed in the HPI.  Physical Exam Updated Vital Signs BP 124/83 (BP Location: Left Arm)   Pulse 74   Temp 98.3 F (36.8 C) (Oral)   Resp  16   Ht 5\' 1"  (1.549 m)   SpO2 99%   BMI 35.75 kg/m   Physical Exam Vitals signs and nursing note reviewed.  Constitutional:      General: He is not in acute distress.    Appearance: He is well-developed.  HENT:     Head: Normocephalic and atraumatic.  Eyes:     Pupils: Pupils are equal, round, and reactive to light.  Neck:     Musculoskeletal: Normal range of motion and neck supple.  Cardiovascular:     Rate and Rhythm: Normal rate and regular rhythm.     Heart sounds: Normal heart sounds. No murmur. No friction rub. No gallop.   Pulmonary:     Effort: Pulmonary effort is normal. No respiratory distress.     Breath sounds: Normal breath sounds. No decreased breath sounds, wheezing, rhonchi or rales.  Abdominal:     General: Bowel sounds are normal. There is no distension.     Palpations: Abdomen is soft.     Tenderness: There is no abdominal tenderness.  Skin:    General: Skin is warm and dry.     Capillary Refill: Capillary refill takes less than 2 seconds.     Findings: No erythema or rash.  Neurological:     Mental Status: He is alert and oriented to person, place, and time.     Motor: No abnormal muscle tone.     Coordination: Coordination normal.  Psychiatric:        Behavior: Behavior normal.      ED Treatments / Results  Labs (all labs ordered are listed, but only abnormal results are displayed) Labs Reviewed  CBC WITH DIFFERENTIAL/PLATELET - Abnormal; Notable for the following components:      Result Value   RBC 3.60 (*)    Hemoglobin 11.4 (*)    MCV 102.2 (*)    All other components within normal limits  BASIC METABOLIC PANEL  URINALYSIS, ROUTINE W REFLEX MICROSCOPIC  RAPID URINE DRUG SCREEN, HOSP PERFORMED  PREGNANCY, URINE    EKG EKG Interpretation  Date/Time:  Saturday August 19 2018 15:31:08 EST Ventricular Rate:  72 PR Interval:    QRS Duration: 153 QT Interval:  445 QTC Calculation: 487 R Axis:   129 Text Interpretation:  Sinus  rhythm RBBB and LPFB No significant change since last tracing Confirmed by Jacalyn Lefevre [phone]) on 08/19/2018 3:43:53 PM   Radiology Dg Chest 2 View  Result Date: 08/19/2018 CLINICAL DATA:  Intermittent chest pain for the past month. EXAM: CHEST - 2 VIEW COMPARISON:  Chest x-ray dated September 17, 2017. FINDINGS: The heart size and mediastinal contours are within normal limits. Both lungs are clear. The visualized skeletal structures are unremarkable. IMPRESSION: No active cardiopulmonary disease. Electronically Signed   By: Obie Dredge M.D.   On: 08/19/2018 16:44    Procedures Procedures (including critical care time)  Medications Ordered  in ED Medications - No data to display   Initial Impression / Assessment and Plan / ED Course  I have reviewed the triage vital signs and the nursing notes.  Pertinent labs & imaging results that were available during my care of the patient were reviewed by me and considered in my medical decision making (see chart for details).       Patient will be transferred to the behavioral health center.  The chest pain is chronic in nature and I do not feel there is any acute changes.  Final Clinical Impressions(s) / ED Diagnoses   Final diagnoses:  Major depressive disorder, recurrent severe without psychotic features [facility])    ED Discharge Orders    None       Charlestine Night, PA-C 08/19/18 2305    Jacalyn Lefevre, MD 08/19/18 2325

## 2018-08-19 NOTE — ED Triage Notes (Signed)
Pt brought over from [facility] for medical clearance due to chest pains for month. Pt has bed at [facility] once medically cleared.

## 2018-08-19 NOTE — Tx Team (Addendum)
"  Initial Treatment Plan 08/19/2018 11:40 PM [name] EML:[id number]    PATIENT STRESSORS: Other: pt reports increased depression and self-harm thoughts   PATIENT STRENGTHS: Ability for insight Average or above average intelligence Motivation for treatment/growth Supportive family/friends Work skills   PATIENT IDENTIFIED PROBLEMS: Depression  SI  Self harm behaviors  Anxiety      " I want to help myself get stronger and have a better support system."         DISCHARGE CRITERIA:  Improved stabilization in mood, thinking, and/or behavior  PRELIMINARY DISCHARGE PLAN: Return to previous living arrangement  PATIENT/FAMILY INVOLVEMENT: This treatment plan has been presented to and reviewed with the patient, Diane Mann, and/or family member.  The patient and family have been given the opportunity to ask questions and make suggestions.  Floyce Stakes, RN 08/19/2018, 11:40 PM

## 2018-08-19 NOTE — ED Notes (Signed)
Bed: [facility] Expected date:  Expected time:  Means of arrival:  Comments: 

## 2018-08-19 NOTE — ED Notes (Signed)
Pt given urine cup to provide urine sample.

## 2018-08-20 DIAGNOSIS — F332 Major depressive disorder, recurrent severe without psychotic features: Principal | ICD-10-CM

## 2018-08-20 MED ORDER — CITALOPRAM HYDROBROMIDE 10 MG PO TABS
10.0000 mg | ORAL_TABLET | Freq: Every day | ORAL | Status: DC
  Administered 2018-08-20 – 2018-08-24 (×5): 10 mg via ORAL
  Filled 2018-08-20 (×9): qty 1

## 2018-08-20 MED ORDER — ONDANSETRON 4 MG PO TBDP
4.0000 mg | ORAL_TABLET | Freq: Three times a day (TID) | ORAL | Status: DC | PRN
  Administered 2018-08-20: 4 mg via ORAL
  Filled 2018-08-20: qty 1

## 2018-08-20 MED ORDER — ARIPIPRAZOLE 5 MG PO TABS
5.0000 mg | ORAL_TABLET | Freq: Every day | ORAL | Status: DC
  Administered 2018-08-20 – 2018-08-21 (×2): 5 mg via ORAL
  Filled 2018-08-20 (×6): qty 1

## 2018-08-20 NOTE — Progress Notes (Addendum)
Patient has been up in the dayroom briefly tonight after his mother visited. He reports having some nausea today and thinks its because he has not been eating much prior to his admission and ate some today. He received zofran earlier and still had complaints of feeling nauseated. He received ginger ale to sip on. He reported that he may not need sleep aid tonight since taking his other medication today. Patient has been up in the dayroom laughing and talking with peers. Support given and safety maintained on unit with 15 min checks.

## 2018-08-20 NOTE — Progress Notes (Signed)
Patient admitted voluntarily for increased depression and anxiety. He is trans gender  Female to female and wants to be called Diane Mann.  Patient reports family stressors but does not elaborate. He has self harm thoughts and has superficial scratches to his lower left arm. He reports hearing voices that tells him he is worthless and not needed. Patient has past medical hx of asthma, anxiety and a congenital heart problem. Skin assessment done, oriented to unit, snacks given as requested. Safety maintained on unit with 15 min checks.
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
## 2018-08-20 NOTE — Progress Notes (Signed)
Pt presents with a flat affect and a depressed mood. Pt reported increased depression and anxiety prior to admission. Pt denied taking any medications for depression or anxiety prior to admission. Pt denies any active SI/HI. Pt denies any active AVH. Pt reported fair sleep last night. Pt reported having a fair appetite today. Pt noted to be minimal and forwards little information during assessment. No concerns verbalized by pt.  Orders reviewed with pt. Verbal support provided. Pt encouraged to attend groups. 15 minute checks performed for safety.   Pt compliant with tx plan.

## 2018-08-20 NOTE — Plan of Care (Signed)
  Problem: Education: Goal: Verbalization of understanding the information provided will improve Outcome: Progressing  Pt verbalized understanding of restarting Abilify and starting new med celexa.  Problem: Activity: Goal: Interest or engagement in activities will improve Outcome: Progressing  Pt compliant with attending scheduled groups.

## 2018-08-20 NOTE — [facility] Group Notes (Signed)
[facility] LCSW Group Therapy Note  08/20/2018   10:00-11:00AM  Type of Therapy and Topic:  Group Therapy:  Unhealthy versus Healthy Supports, Which Am I?  Participation Level:  Active   Description of Group:  Patients in this group were introduced to the concept that additional supports including self-support are an essential part of recovery.  Initially a discussion was held about the differences between healthy versus unhealthy supports.  Patients were asked to share what unhealthy supports in their lives need to be addressed, as well as what additional healthy supports could be added for greater help in reaching their goals.   A song entitled "My Own Hero" was played and a group discussion ensued in which patients stated they could relate to the song and it inspired them to realize they have be willing to help themselves in order to succeed, because other people cannot achieve sobriety or stability for them.  We discussed adding a variety of healthy supports to address the various needs in patient lives, including becoming more self-supportive.  Therapeutic Goals: 1)  Highlight the differences between healthy and unhealthy supports 2)  Suggest the importance of being a part of one's own support system 2)  Discuss reasons people in one's life may eventually be unable to be continually supportive  3)  Identify the patient's current support system and   4)  elicit commitments to add healthy supports and to become more conscious of being self-supportive   Summary of Patient Progress:  The patient expressed that the unhealthy support which needs to be addressed includes family members who say "ignore it" or "get over it," as well as friends to whom patient will provide help but who in return will not give any help.  Healthy supports which could be added for increased stability and happiness include relying on friends who are in fact helpful, girlfriend, and therapist.  The patient did not really talk about  whether he/she is a positive support for self..  Therapeutic Modalities:   Motivational Interviewing Activity  Lynnell Chad

## 2018-08-20 NOTE — Progress Notes (Signed)
Nutrition Brief Note  Patient identified on the Malnutrition Screening Tool (MST) Report  Patient with insignificant weight loss for time frame. No supplement warranted at this time.   Wt Readings from Last 15 Encounters:  08/19/18 85.7 kg (96 %, Z= 1.77)*  07/10/18 85.8 kg (96 %, Z= 1.78)*  03/06/18 87.8 kg (97 %, Z= 1.86)*  11/03/17 89.2 kg (97 %, Z= 1.91)*  10/09/17 91.6 kg (98 %, Z= 1.99)*  09/17/17 91.7 kg (98 %, Z= 1.99)*  08/01/17 94.3 kg (98 %, Z= 2.07)*  03/30/17 97.3 kg (98 %, Z= 2.15)*  12/28/16 95.8 kg (98 %, Z= 2.12)*  12/20/16 96.8 kg (98 %, Z= 2.14)*  11/30/16 98.2 kg (99 %, Z= 2.17)*  08/30/16 94.1 kg (98 %, Z= 2.08)*  05/19/16 94.8 kg (98 %, Z= 2.11)*  04/03/16 92.3 kg (98 %, Z= 2.05)*  03/15/16 93.5 kg (98 %, Z= 2.08)*   * Growth percentiles are based on CDC (Girls, 2-20 Years) data.    Body mass index is 35.71 kg/m. Patient meets criteria for obesity based on current BMI for age. Labs and medications reviewed.   No nutrition interventions warranted at this time. If nutrition issues arise, please consult RD.   Tilda Franco, MS, RD, LDN [facility] Inpatient Clinical Dietitian Pager: [phone] After Hours Pager: [phone]

## 2018-08-20 NOTE — Progress Notes (Signed)
Date:  08/20/2018  Time:  1300  Type of Therapy:  Nurse Education/Shalita, RN  Participation Level:  Active  Participation Quality:  Appropriate  Affect:  Appropriate  Cognitive:  Appropriate  Insight:  Appropriate  Engagement in Group:  Engaged  Modes of Intervention:  Discussion and Education   

## 2018-08-21 MED ORDER — TRAZODONE HCL 50 MG PO TABS
50.0000 mg | ORAL_TABLET | Freq: Every evening | ORAL | Status: DC | PRN
  Administered 2018-08-21: 50 mg via ORAL
  Filled 2018-08-21: qty 1

## 2018-08-21 NOTE — Progress Notes (Signed)
[facility] MD Progress Note  08/21/2018 12:45 PM Diane Mann  MRN:  [id number] Subjective: Patient reports some improvement compared to admission, states the depression is improving.  Complains of nausea, which she states has been intermittent and present even prior to admission.  Denies vomiting.  Today denies suicidal ideations.  Objective: I have discussed case with treatment team and have met with patient.  18 year old, identifies as transgender female, presents for worsening depression, anxiety neurovegetative symptoms of depression, passive suicidal ideations.  Contributing stressors include work-related stress and feeling unsupported by family. Today patient reports partial improvement compared to how he felt on admission.  He does continue to present depressed with a constricted affect, although affect does improve partially during session and he smiles briefly at times.  Denies suicidal ideations.  As above, complains of nausea, not associated with vomiting, denies diarrhea, denies significant abdominal pain.  He states that nausea has been intermittent Diane Mann occurring prior to admission.  Was able to eat most of his breakfast this morning.  Limited group participation.  Calm/cooperative on approach.  No disruptive or agitated behaviors.  Denies medication side effects.  Principal Problem: Depression Diagnosis: Active Problems:   Major depressive disorder, recurrent severe without psychotic features ([place])  Total Time spent with patient: 20 minutes  Past Psychiatric History:  Past Medical History:  Past Medical History:  Diagnosis Date  . Anxiety   . Asthma   . Asthma 04/24/2015  . 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 Congenital heart problem    blockage  . Headache   . Obesity   . Panic attacks 04/24/2015  . RBBB (right bundle branch block) 04/24/2015  . Social anxiety disorder 04/24/2015    Past Surgical History:  Procedure Laterality Date  . MOUTH SURGERY  05/04/2017   Wisdom  teeth ectraction Bilateral    Family History:  Family History  Problem Relation Age of Onset  . Lupus Mother   . Heart disease Father    Family Psychiatric  History:  Social History:  Social History   Substance and Sexual Activity  Alcohol Use No     Social History   Substance and Sexual Activity  Drug Use No    Social History   Socioeconomic History  . Marital status: Single    Spouse name: Not on file  . Number of children: Not on file  . Years of education: 50  . Highest education level: 12th grade  Occupational History  . Not on file  Social Needs  . Financial resource strain: Not on file  . Food insecurity:    Worry: Patient refused    Inability: Patient refused  . Transportation needs:    Medical: Patient refused    Non-medical: Patient refused  Tobacco Use  . Smoking status: Never Smoker  . Smokeless tobacco: Never Used  Substance and Sexual Activity  . Alcohol use: No  . Drug use: No  . Sexual activity: Not on file  Lifestyle  . Physical activity:    Days per week: Patient refused    Minutes per session: Patient refused  . Stress: Not on file  Relationships  . Social connections:    Talks on phone: Patient refused    Gets together: Patient refused    Attends religious service: Patient refused    Active member of club or organization: Patient refused    Attends meetings of clubs or organizations: Patient refused    Relationship status: Patient refused  Other Topics Concern  . Not  on file  Social History Narrative   Diane Mann lives with her mom and stepfather, 58 years old brother.  Mom works in housekeeping.  Diane Mann graduated from [employer]. Works at [employer] on [employer]    Additional Social History:   Sleep: improving   Appetite:  Fair  Current Medications: Current Facility-Administered Medications  Medication Dose Route Frequency Provider Last Rate Last Dose  . acetaminophen (TYLENOL) tablet 650 mg  650 mg Oral Q6H PRN Patrecia Pour, NP   650 mg at 08/20/18 1412  . alum & mag hydroxide-simeth (MAALOX/MYLANTA) 200-200-20 MG/5ML suspension 30 mL  30 mL Oral Q4H PRN Patrecia Pour, NP      . ARIPiprazole (ABILIFY) tablet 5 mg  5 mg Oral Daily Branson Kranz, Myer Peer, MD   5 mg at 08/21/18 0755  . citalopram (CELEXA) tablet 10 mg  10 mg Oral Daily Lesly Joslyn, Myer Peer, MD   10 mg at 08/21/18 0755  . magnesium hydroxide (MILK OF MAGNESIA) suspension 30 mL  30 mL Oral Daily PRN Patrecia Pour, NP      . norethindrone-ethinyl estradiol-iron (MICROGESTIN FE,GILDESS FE,LOESTRIN FE) 1.5-30 MG-MCG tablet 1 tablet  1 tablet Oral Daily Patrecia Pour, NP      . ondansetron (ZOFRAN-ODT) disintegrating tablet 4 mg  4 mg Oral Q8H PRN Derrill Center, NP   4 mg at 08/20/18 1725  . traZODone (DESYREL) tablet 50 mg  50 mg Oral QHS PRN Rozetta Nunnery, NP   50 mg at 08/19/18 2324    Lab Results:  Results for orders placed or performed during the hospital encounter of 08/19/18 (from the past 48 hour(s))  Basic metabolic panel     Status: None   Collection Time: 08/19/18  7:24 PM  Result Value Ref Range   Sodium 138 135 - 145 mmol/L   Potassium 3.8 3.5 - 5.1 mmol/L   Chloride 104 98 - 111 mmol/L   CO2 25 22 - 32 mmol/L   Glucose, Bld 84 70 - 99 mg/dL   BUN 13 6 - 20 mg/dL   Creatinine, Ser 0.79 0.44 - 1.00 mg/dL   Calcium 9.3 8.9 - 10.3 mg/dL   GFR calc non Af Amer >60 >60 mL/min   GFR calc Af Amer >60 >60 mL/min   Anion gap 9 5 - 15    Comment: Performed at [facility], [place] [address], [place], [place] [address]  CBC with Differential     Status: Abnormal   Collection Time: 08/19/18  7:24 PM  Result Value Ref Range   WBC 7.0 4.0 - 10.5 K/uL   RBC 3.60 (L) 3.87 - 5.11 MIL/uL   Hemoglobin 11.4 (L) 12.0 - 15.0 g/dL   HCT 36.8 36.0 - 46.0 %   MCV 102.2 (H) 80.0 - 100.0 fL   MCH 31.7 26.0 - 34.0 pg   MCHC 31.0 30.0 - 36.0 g/dL   RDW 12.8 11.5 - 15.5 %   Platelets 250 150 - 400 K/uL   nRBC 0.0 0.0 - 0.2 %    Neutrophils Relative % 56 %   Neutro Abs 3.9 1.7 - 7.7 K/uL   Lymphocytes Relative 37 %   Lymphs Abs 2.6 0.7 - 4.0 K/uL   Monocytes Relative 5 %   Monocytes Absolute 0.4 0.1 - 1.0 K/uL   Eosinophils Relative 1 %   Eosinophils Absolute 0.0 0.0 - 0.5 K/uL   Basophils Relative 1 %   Basophils Absolute 0.1 0.0 -  0.1 K/uL   Immature Granulocytes 0 %   Abs Immature Granulocytes 0.01 0.00 - 0.07 K/uL    Comment: Performed at [facility], [place] [address], [place], [place] [address]  Urinalysis, Routine w reflex microscopic     Status: None   Collection Time: 08/19/18  7:24 PM  Result Value Ref Range   Color, Urine YELLOW YELLOW   APPearance CLEAR CLEAR   Specific Gravity, Urine 1.023 1.005 - 1.030   pH 6.0 5.0 - 8.0   Glucose, UA NEGATIVE NEGATIVE mg/dL   Hgb urine dipstick NEGATIVE NEGATIVE   Bilirubin Urine NEGATIVE NEGATIVE   Ketones, ur NEGATIVE NEGATIVE mg/dL   Protein, ur NEGATIVE NEGATIVE mg/dL   Nitrite NEGATIVE NEGATIVE   Leukocytes,Ua NEGATIVE NEGATIVE    Comment: Performed at [facility], [place] [address], [place], [place] [address]  Urine rapid drug screen (hosp performed)     Status: None   Collection Time: 08/19/18  7:24 PM  Result Value Ref Range   Opiates NONE DETECTED NONE DETECTED   Cocaine NONE DETECTED NONE DETECTED   Benzodiazepines NONE DETECTED NONE DETECTED   Amphetamines NONE DETECTED NONE DETECTED   Tetrahydrocannabinol NONE DETECTED NONE DETECTED   Barbiturates NONE DETECTED NONE DETECTED    Comment: (NOTE) DRUG SCREEN FOR MEDICAL PURPOSES ONLY.  IF CONFIRMATION IS NEEDED FOR ANY PURPOSE, NOTIFY LAB WITHIN 5 DAYS. LOWEST DETECTABLE LIMITS FOR URINE DRUG SCREEN Drug Class                     Cutoff (ng/mL) Amphetamine and metabolites    1000 Barbiturate and metabolites    200 Benzodiazepine                 888 Tricyclics and metabolites     300 Opiates and metabolites        300 Cocaine and metabolites         300 THC                            50 Performed at [facility], [place] [address], [place], [place] [address]   Pregnancy, urine     Status: None   Collection Time: 08/19/18  7:24 PM  Result Value Ref Range   Preg Test, Ur NEGATIVE NEGATIVE    Comment:        THE SENSITIVITY OF THIS METHODOLOGY IS >20 mIU/mL. Performed at [facility], [place] [address], [place],  [address]     Blood Alcohol level:  No results found for: [facility]  Metabolic Disorder Labs: Lab Results  Component Value Date   HGBA1C 5.3 07/10/2018   MPG 114 12/20/2016   Lab Results  Component Value Date   PROLACTIN 13.4 08/05/2014   Lab Results  Component Value Date   CHOL 144 03/30/2017   TRIG 88 03/30/2017   HDL 54 03/30/2017   CHOLHDL 2.7 03/30/2017   VLDL 23 12/20/2016   LDLCALC 73 03/30/2017   LDLCALC 76 12/20/2016    Physical Findings: AIMS: Facial and Oral Movements Muscles of Facial Expression: None, normal Lips and Perioral Area: None, normal Jaw: None, normal Tongue: None, normal,Extremity Movements Upper (arms, wrists, hands, fingers): None, normal Lower (legs, knees, ankles, toes): None, normal, Trunk Movements Neck, shoulders, hips: None, normal, Overall Severity Severity of abnormal movements (highest score from questions above): None, normal Incapacitation due to abnormal movements: None, normal Patient's awareness of  abnormal movements (rate only patient's report): No Awareness, Dental Status Current problems with teeth and/or dentures?: No Does patient usually wear dentures?: No  CIWA:    COWS:     Musculoskeletal: Strength & Muscle Tone: within normal limits Gait & Station: normal Patient leans: N/A  Psychiatric Specialty Exam: Physical Exam  ROS denies headache, no chest pain , no shortness of breath, (+) nausea, no vomiting, no diarrhea, no fever or chills  Blood pressure 117/78, pulse 83, temperature 98.1 F (36.7 C), temperature  source Oral, resp. rate 16, height _0  (1.549 m), weight 85.7 kg.Body mass index is 35.71 kg/m.  General Appearance: Fairly Groomed  Eye Contact:  Fair  Speech:  Normal Rate  Volume:  Decreased  Mood:  reports some improvement, remains depressed   Affect:  Constricted and Does smile briefly at times appropriately  Thought Process:  Linear and Descriptions of Associations: Intact  Orientation:  Full (Time, Place, and Person)  Thought Content:  No hallucinations, no delusions  Suicidal Thoughts:  No denies suicidal ideations, no self-injurious ideations  Homicidal Thoughts:  No no homicidal or violent ideations  Memory:  Recent and remote grossly intact  Judgement:  Fair/improving  Insight:  Fair/improving  Psychomotor Activity:  Normal  Concentration:  Concentration: Good and Attention Span: Good  Recall:  Good  Fund of Knowledge:  Good  Language:  Good  Akathisia:  Negative  Handed:  Right  AIMS (if indicated):     Assets:  Communication Skills Desire for Improvement Resilience  ADL's:  Intact  Cognition:  WNL  Sleep:  Number of Hours: 6.75   Assessment- 18 year old, identifies as transgender female, presents for worsening depression, anxiety neurovegetative symptoms of depression, passive suicidal ideations.  Contributing stressors include work-related stress and feeling unsupported by family.  Today patient reports some improvement compared to admission, but continues to present vaguely depressed, anxious, with a subdued affect. Denies suicidal ideations. Denies medication side effects and does not feel Celexa /Abilify trials are necessarily causing or contributing to nausea, as has experienced nausea prior to admission as well. No vomiting , able to tolerate PO intake. *I have reviewed EKG with cardiologist-patient has history of  RBBB/ LPFB affecting QTc reading - not considered to have prolonged QTc at this time. Treatment Plan Summary: Daily contact with patient to assess  and evaluate symptoms and progress in treatment, Medication management, Plan inpatient treatment and medications as below Encourage group and milieu participation to work on coping skills and symptom reduction Treatment team working on disposition planning options  Continue Abilify 5 mg daily for antidepressant augmentation/mood disorder Continue Celexa 10 mg daily for depression Continue Trazodone 50 mg nightly PRN for insomnia as needed  Jenne Campus, MD 08/21/2018, 12:45 PM

## 2018-08-21 NOTE — Progress Notes (Signed)
Patient ID: Diane Mann, adult   DOB: 12-14-2000, 18 y.o.   MRN: [id number]  Nursing Progress Note [id number]  Patient is female to female transgender and requests staff use his/him pronouns and address patient by the name Diane Mann. On initial approach, patient is seen resting in bed. Patient presents with sad/sullen affect and depressed mood. Patient compliant with scheduled medications and reports he does not have a home supply of birth control. Patient currently denies SI/HI/AVH this morning but does report still having negative thoughts. Patient reports on-going nausea but states he ate breakfast this morning; ginger ale provided.   Patient is educated about and provided medication per provider's orders. Patient safety maintained with q15 min safety checks and low fall risk precautions. Emotional support given, 1:1 interaction, and active listening provided. Patient encouraged to attend meals, groups, and work on treatment plan and goals. Labs, vital signs and patient behavior monitored throughout shift.   Patient contracts for safety with staff. Patient remains safe on the unit at this time and agrees to come to staff with any issues/concerns. Patient is interacting with peers appropriately on the unit. Will continue to support and monitor.   Patient's self-inventory sheet Rated Energy Level  Normal  Rated Sleep  Good  Rated Appetite  Fair  Rated Anxiety (0-10)  3  Rated Hopelessness (0-10)  3  Rated Depression (0-10)  3  Daily Goal  "working on keeping positive and make the best out of my day, go to groups"  Any Additional Comments:  N/A
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
## 2018-08-21 NOTE — Progress Notes (Signed)
Pt attended wrap-up group. Pt appear animated/anxious in affect and mood. Pt denies SI/HI/AVH/Pain at this time. Pt states she is going to start gender transitioning process once d/c. Trazodone requested for sleep. Support offered. Will continue with POC.

## 2018-08-21 NOTE — Tx Team (Signed)
Interdisciplinary Treatment and Diagnostic Plan Update  08/21/2018 Time of Session:  Diane Mann MRN: [id number]  Principal Diagnosis: <principal problem not specified>  Secondary Diagnoses: Active Problems:   Major depressive disorder, recurrent severe without psychotic features (HCC)   Current Medications:  Current Facility-Administered Medications  Medication Dose Route Frequency Provider Last Rate Last Dose  . acetaminophen (TYLENOL) tablet 650 mg  650 mg Oral Q6H PRN Charm Rings, NP   650 mg at 08/20/18 1412  . alum & mag hydroxide-simeth (MAALOX/MYLANTA) 200-200-20 MG/5ML suspension 30 mL  30 mL Oral Q4H PRN Charm Rings, NP      . ARIPiprazole (ABILIFY) tablet 5 mg  5 mg Oral Daily Cobos, Rockey Situ, MD   5 mg at 08/21/18 0755  . citalopram (CELEXA) tablet 10 mg  10 mg Oral Daily Cobos, Rockey Situ, MD   10 mg at 08/21/18 0755  . magnesium hydroxide (MILK OF MAGNESIA) suspension 30 mL  30 mL Oral Daily PRN Charm Rings, NP      . norethindrone-ethinyl estradiol-iron (MICROGESTIN FE,GILDESS FE,LOESTRIN FE) 1.5-30 MG-MCG tablet 1 tablet  1 tablet Oral Daily Charm Rings, NP      . ondansetron (ZOFRAN-ODT) disintegrating tablet 4 mg  4 mg Oral Q8H PRN Oneta Rack, NP   4 mg at 08/20/18 1725  . traZODone (DESYREL) tablet 50 mg  50 mg Oral QHS PRN Nira Conn A, NP   50 mg at 08/19/18 2324   PTA Medications: Medications Prior to Admission  Medication Sig Dispense Refill Last Dose  . acetaminophen (TYLENOL) 325 MG tablet Take 650 mg by mouth every 6 (six) hours as needed for mild pain or headache.     . albuterol (PROVENTIL HFA;VENTOLIN HFA) 108 (90 Base) MCG/ACT inhaler Inhale into the lungs every 6 (six) hours as needed for wheezing or shortness of breath.   Taking  . Cetirizine HCl 10 MG CAPS Take 1 capsule (10 mg total) by mouth daily for 10 days. 10 capsule 0   . diclofenac sodium (VOLTAREN) 1 % GEL Apply 2 g topically 4 (four) times daily. (Patient not  taking: Reported on 07/10/2018) 1 Tube 0 Unknown at Unknown time  . ibuprofen (ADVIL,MOTRIN) 800 MG tablet Take 1 tablet (800 mg total) by mouth 3 (three) times daily. (Patient not taking: Reported on 07/10/2018) 21 tablet 0 More than a month at Unknown time  . norethindrone-ethinyl estradiol-iron (MICROGESTIN FE 1.5/30) 1.5-30 MG-MCG tablet Take 1 tablet by mouth daily. Skip placebo pills x 2 months. On the third month- take the placebo (brown) pills. 84 tablet 11 More than a month at Unknown time    Patient Stressors: Other: pt reports increased depression and self-harm thoughts  Patient Strengths: Ability for insight Average or above average intelligence Motivation for treatment/growth Supportive family/friends Work skills  Treatment Modalities: Medication Management, Group therapy, Case management,  1 to 1 session with clinician, Psychoeducation, Recreational therapy.   Physician Treatment Plan for Primary Diagnosis: <principal problem not specified> Long Term Goal(s): Improvement in symptoms so as ready for discharge Improvement in symptoms so as ready for discharge   Short Term Goals: Ability to identify changes in lifestyle to reduce recurrence of condition will improve Ability to maintain clinical measurements within normal limits will improve Ability to identify changes in lifestyle to reduce recurrence of condition will improve Ability to verbalize feelings will improve Ability to disclose and discuss suicidal ideas Ability to demonstrate self-control will improve Ability to identify and develop effective  coping behaviors will improve Ability to maintain clinical measurements within normal limits will improve  Medication Management: Evaluate patient's response, side effects, and tolerance of medication regimen.  Therapeutic Interventions: 1 to 1 sessions, Unit Group sessions and Medication administration.  Evaluation of Outcomes: Progressing  Physician Treatment Plan for  Secondary Diagnosis: Active Problems:   Major depressive disorder, recurrent severe without psychotic features (HCC)  Long Term Goal(s): Improvement in symptoms so as ready for discharge Improvement in symptoms so as ready for discharge   Short Term Goals: Ability to identify changes in lifestyle to reduce recurrence of condition will improve Ability to maintain clinical measurements within normal limits will improve Ability to identify changes in lifestyle to reduce recurrence of condition will improve Ability to verbalize feelings will improve Ability to disclose and discuss suicidal ideas Ability to demonstrate self-control will improve Ability to identify and develop effective coping behaviors will improve Ability to maintain clinical measurements within normal limits will improve     Medication Management: Evaluate patient's response, side effects, and tolerance of medication regimen.  Therapeutic Interventions: 1 to 1 sessions, Unit Group sessions and Medication administration.  Evaluation of Outcomes: Progressing   RN Treatment Plan for Primary Diagnosis: <principal problem not specified> Long Term Goal(s): Knowledge of disease and therapeutic regimen to maintain health will improve  Short Term Goals: Ability to participate in decision making will improve, Ability to verbalize feelings will improve, Ability to disclose and discuss suicidal ideas, Ability to identify and develop effective coping behaviors will improve and Compliance with prescribed medications will improve  Medication Management: RN will administer medications as ordered by provider, will assess and evaluate patient's response and provide education to patient for prescribed medication. RN will report any adverse and/or side effects to prescribing provider.  Therapeutic Interventions: 1 on 1 counseling sessions, Psychoeducation, Medication administration, Evaluate responses to treatment, Monitor vital signs and CBGs  as ordered, Perform/monitor CIWA, COWS, AIMS and Fall Risk screenings as ordered, Perform wound care treatments as ordered.  Evaluation of Outcomes: Progressing   LCSW Treatment Plan for Primary Diagnosis: <principal problem not specified> Long Term Goal(s): Safe transition to appropriate next level of care at discharge, Engage patient in therapeutic group addressing interpersonal concerns.  Short Term Goals: Engage patient in aftercare planning with referrals and resources  Therapeutic Interventions: Assess for all discharge needs, 1 to 1 time with Social worker, Explore available resources and support systems, Assess for adequacy in community support network, Educate family and significant other(s) on suicide prevention, Complete Psychosocial Assessment, Interpersonal group therapy.  Evaluation of Outcomes: Progressing   Progress in Treatment: Attending groups: Yes. Participating in groups: Yes. Taking medication as prescribed: Yes. Toleration medication: Yes. Family/Significant other contact made: No, will contact:  the patient's girlfriend Patient understands diagnosis: Yes. Discussing patient identified problems/goals with staff: Yes. Medical problems stabilized or resolved: Yes. Denies suicidal/homicidal ideation: Yes. Issues/concerns per patient self-inventory: No. Other:   New problem(s) identified: None  New Short Term/Long Term Goal(s): medication stabilization, elimination of SI thoughts, development of comprehensive mental wellness plan.   Patient Goals: I want to help myself get stronger and have a better support system.    Discharge Plan or Barriers: Patient plans to discharge home with her parents and significant other. She will continue to follow up with [facility] for outpatient medication management and therapy services.   Reason for Continuation of Hospitalization: Depression Hallucinations Medication stabilization Suicidal ideation  Estimated  Length of Stay: 08/25/2018  Attendees: Patient: 08/21/2018 10:46  AM  Physician: Dr. Nehemiah Massed, MD 08/21/2018 10:46 AM  Nursing: Marchelle Folks.Salena Saner, RN 08/21/2018 10:46 AM  RN Care Manager: 08/21/2018 10:46 AM  Social Worker: Baldo Daub, LCSWA 08/21/2018 10:46 AM  Recreational Therapist:  08/21/2018 10:46 AM  Other:  08/21/2018 10:46 AM  Other:  08/21/2018 10:46 AM  Other: 08/21/2018 10:46 AM    Scribe for Treatment Team: Maeola Sarah, LCSWA 08/21/2018 10:46 AM

## 2018-08-21 NOTE — Progress Notes (Signed)
Recreation Therapy Notes  Date:  2. 24. 20 Time: 0930 Location: [address] Dayroom  Group Topic: Stress Management  Goal Area(s) Addresses:  Patient will identify positive stress management techniques. Patient will identify benefits of using stress management post d/c.  Intervention: Worksheet  Activity :  Choice Meditation.  LRT introduced the stress management technique of meditation.  LRT played a meditation that focused on having a choice in changing the way they think about things.  Patients were to follow along as meditation played to engage in activity.  Education:  Stress Management, Discharge Planning.   Education Outcome: Acknowledges Education  Clinical Observations/Feedback: Pt did not attend group.     Diane Mann, LRT/CTRS         Idonna Heeren A 08/21/2018 11:08 AM 

## 2018-08-21 NOTE — Plan of Care (Signed)
  Problem: Education: Goal: Knowledge of [facility] Education information/materials will improve Outcome: Progressing   Problem: Activity: Goal: Interest or engagement in activities will improve Outcome: Progressing   Problem: Health Behavior/Discharge Planning: Goal: Compliance with treatment plan for underlying cause of condition will improve Outcome: Progressing   Problem: Safety: Goal: Periods of time without injury will increase Outcome: Progressing   

## 2018-08-21 NOTE — [facility] Group Notes (Signed)
LCSW Group Therapy Note 08/21/2018 2:44 PM  Type of Therapy and Topic: Group Therapy: Overcoming Obstacles  Participation Level: Did Not Attend  Description of Group:  In this group patients will be encouraged to explore what they see as obstacles to their own wellness and recovery. They will be guided to discuss their thoughts, feelings, and behaviors related to these obstacles. The group will process together ways to cope with barriers, with attention given to specific choices patients can make. Each patient will be challenged to identify changes they are motivated to make in order to overcome their obstacles. This group will be process-oriented, with patients participating in exploration of their own experiences as well as giving and receiving support and challenge from other group members.  Therapeutic Goals: 1. Patient will identify personal and current obstacles as they relate to admission. 2. Patient will identify barriers that currently interfere with their wellness or overcoming obstacles.  3. Patient will identify feelings, thought process and behaviors related to these barriers. 4. Patient will identify two changes they are willing to make to overcome these obstacles:   Summary of Patient Progress  Invited, chose not to attend.    Therapeutic Modalities:  Cognitive Behavioral Therapy Solution Focused Therapy Motivational Interviewing Relapse Prevention Therapy   Alcario Drought Clinical Social Worker

## 2018-08-22 DIAGNOSIS — F333 Major depressive disorder, recurrent, severe with psychotic symptoms: Secondary | ICD-10-CM

## 2018-08-22 LAB — HEPATIC FUNCTION PANEL
ALT: 13 U/L (ref 0–44)
AST: 24 U/L (ref 15–41)
Albumin: 4.6 g/dL (ref 3.5–5.0)
Alkaline Phosphatase: 44 U/L (ref 38–126)
Bilirubin, Direct: <0.1 mg/dL (ref 0.0–0.2)
Total Bilirubin: 0.5 mg/dL (ref 0.3–1.2)
Total Protein: 7.8 g/dL (ref 6.5–8.1)

## 2018-08-22 LAB — AMYLASE: Amylase: 53 U/L (ref 28–100)

## 2018-08-22 LAB — LIPASE, BLOOD: Lipase: 42 U/L (ref 11–51)

## 2018-08-22 MED ORDER — TRAZODONE HCL 50 MG PO TABS
25.0000 mg | ORAL_TABLET | Freq: Every evening | ORAL | Status: DC | PRN
  Administered 2018-08-22: 25 mg via ORAL
  Filled 2018-08-22: qty 1

## 2018-08-22 MED ORDER — ARIPIPRAZOLE 5 MG PO TABS
5.0000 mg | ORAL_TABLET | Freq: Every day | ORAL | Status: DC
  Administered 2018-08-22 – 2018-08-23 (×2): 5 mg via ORAL
  Filled 2018-08-22 (×4): qty 1

## 2018-08-22 NOTE — [facility] Group Notes (Signed)
[facility] Group Notes: Nursing Psychoeducation  Date:  08/22/2018  Time:  4:00 PM  Type of Therapy:  Psychoeducational Skills  Participation Level:  Active  Participation Quality:  Appropriate and Attentive  Affect:  Appropriate  Cognitive:  Alert and Oriented  Insight:  Appropriate and Improving  Engagement in Group:  Developing/Improving, Engaged and Supportive  Modes of Intervention:  Discussion, Reality Testing, Socialization and Support  Summary of Progress/Problems: Patient reports she is anxious about transitioning and gaining acceptance from his mom. Patient discussed the best and worse case scenarios of his anxiety and states steps he could take to prevent the worst case scenerio. Patient was supportive of peers and contributed positively to group.  Diane Mann A Diane Mann 08/22/2018, 5:00 PM

## 2018-08-22 NOTE — Progress Notes (Signed)
Patient ID: Diane Mann, adult   DOB: [birth year], 18 y.o.   MRN: [id number]  Nursing Progress Note [phone]  On initial approach, patient is seen up in the milieu. Patient presents pleasant and cooperative. Patient compliant with scheduled medications. Patient requests to take Abilify at bedtime due to daytime sedation; pharmacy notified. Patient is seen attending groups and visible in the milieu. Patient currently denies SI/HI/AVH or concerns for Clinical research associate.   Patient is educated about and provided medication per provider's orders. Patient safety maintained with q15 min safety checks and low fall risk precautions. Emotional support given, 1:1 interaction, and active listening provided. Patient encouraged to attend meals, groups, and work on treatment plan and goals. Labs, vital signs and patient behavior monitored throughout shift.   Patient contracts for safety with staff. Patient remains safe on the unit at this time and agrees to come to staff with any issues/concerns. Patient is interacting with peers appropriately on the unit. Will continue to support and monitor.  Patient's self-inventory sheet Rated Energy Level  Normal  Rated Sleep  Good  Rated Appetite  Fair  Rated Anxiety (0-10)  4  Rated Hopelessness (0-10)  0  Rated Depression (0-10)  4  Daily Goal  "getting to see my Child psychotherapist and see if I have a discharge time"  Any Additional Comments:  N/A

## 2018-08-22 NOTE — Progress Notes (Signed)
Pt attended wrap-up group. Pt appears animated/anxious in affect and mood. Pt denies SI/HI/AVH/Pain at this time. Pt states he had a better day today. Trazodone was decrease tonight to 25 mg. Support offered. Will continue with POC.

## 2018-08-22 NOTE — Progress Notes (Signed)
Pt attended spiritual care group on grief and loss facilitated by chaplain Burnis Kingfisher   Group opened with brief discussion and psycho-social ed around grief and loss in relationships and in relation to self - identifying life patterns, circumstances, changes that cause losses. Established group norm of speaking from own life experience. Group goal of establishing open and affirming space for members to share loss and experience with grief, normalize grief experience and provide psycho social education and grief support.    Diane Mann was present throughout group.  Did not engage in group discussion.  Fell asleep about halfway through group.     Burnis Kingfisher, MDiv, [facility]

## 2018-08-22 NOTE — Progress Notes (Signed)
Recreation Therapy Notes  Animal-Assisted Activity (AAA) Program Checklist/Progress Notes Patient Eligibility Criteria Checklist & Daily Group note for Rec Tx Intervention  Date: 2.25.20 Time: 1430 Location: [address]   AAA/T Program Assumption of Risk Form signed by Engineer, production or Parent Legal Guardian  YES   Patient is free of allergies or sever asthma  YES   Patient reports no fear of animals  YES   Patient reports no history of cruelty to animals  YES   Patient understands his/her participation is voluntary  YES   Patient washes hands before animal contact  YES   Patient washes hands after animal contact  YES   Behavioral Response:  Engaged  Education: Charity fundraiser, Appropriate Animal Interaction   Education Outcome: Acknowledges understanding/In group clarification offered/Needs additional education.   Clinical Observations/Feedback:  Pt attended and participated in activity.    Caroll Rancher, LRT/CTRS         Caroll Rancher A 08/22/2018 3:11 PM

## 2018-08-22 NOTE — Progress Notes (Signed)
[facility] MD Progress Note  08/22/2018 11:10 AM Diane Mann  MRN:  [id number] Subjective:  "I'm good. I'm a little sleepy from the medicine."  Diane Mann participating in group therapy on the unit. Pleasant on approach, smiles appropriately at times. He reports improving mood. He continues to report nausea, stating this problem has been chronic for the last two months but has not sought medical care for it. He also reports one episode of vomiting yesterday morning before lunch; he denies any vomiting prior to admission. He is still nauseated today but denies further vomiting. Denies cramping, abdominal pain or diarrhea. He has still been eating. He feels Abilify and Celexa have been helpful and does not want to discontinue. Abilify admin time has been moved to HS starting tonight due to daytime sedation. He continues to report some sleepiness today despite good sleep last night. He took trazodone 50 mg last night and is agreeable to try 25 mg dose tonight. Denies AVH since Abilify was started. Denies SI/HI.  From admission H&P: 18 year old , identifies self as transgender female, requesting to be called " Diane Mann". Reports worsening depression and anxiety. States has chronic depression which waxes and wanes but has been worsening over recent weeks to months, which he attributes in part to a negative work environment and strained relationship with family, partly because they do not support transgender status. Endorses neuro-vegetative symptoms of depression as below, and describes recent suicidal ideations, without plan or intention. After a long period of not engaging in self cutting, did recently cut self earlier this week, with the purpose of addressing negative affects. Reports intermittent auditory hallucinations, mainly during periods of severe anxiety, which he describes as whispers , making hyper-critical, insulting comments  Principal Problem: Major depressive disorder, recurrent episode, severe  (HCC) Diagnosis: Principal Problem:   Major depressive disorder, recurrent episode, severe (HCC)  Total Time spent with patient: 15 minutes  Past Psychiatric History: See admission H&P  Past Medical History:  Past Medical History:  Diagnosis Date  . Anxiety   . Asthma   . Asthma 04/24/2015  . 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 Congenital heart problem    blockage  . Headache   . Obesity   . Panic attacks 04/24/2015  . RBBB (right bundle branch block) 04/24/2015  . Social anxiety disorder 04/24/2015    Past Surgical History:  Procedure Laterality Date  . MOUTH SURGERY  05/04/2017   Wisdom teeth ectraction Bilateral    Family History:  Family History  Problem Relation Age of Onset  . Lupus Mother   . Heart disease Father    Family Psychiatric  History: See admission H&P Social History:  Social History   Substance and Sexual Activity  Alcohol Use No     Social History   Substance and Sexual Activity  Drug Use No    Social History   Socioeconomic History  . Marital status: Single    Spouse name: Not on file  . Number of children: Not on file  . Years of education: 30  . Highest education level: 12th grade  Occupational History  . Not on file  Social Needs  . Financial resource strain: Not on file  . Food insecurity:    Worry: Patient refused    Inability: Patient refused  . Transportation needs:    Medical: Patient refused    Non-medical: Patient refused  Tobacco Use  . Smoking status: Never Smoker  . Smokeless tobacco: Never Used  Substance and  Sexual Activity  . Alcohol use: No  . Drug use: No  . Sexual activity: Not on file  Lifestyle  . Physical activity:    Days per week: Patient refused    Minutes per session: Patient refused  . Stress: Not on file  Relationships  . Social connections:    Talks on phone: Patient refused    Gets together: Patient refused    Attends religious service: Patient refused    Active member of club or  organization: Patient refused    Attends meetings of clubs or organizations: Patient refused    Relationship status: Patient refused  Other Topics Concern  . Not on file  Social History Narrative   Diane Mann lives with her mom and stepfather, 20 years old brother.  Mom works in housekeeping.  Diane Mann graduated from [employer]. Works at [employer] on [address]    Additional Social History:                         Sleep: Good  Appetite:  Fair  Current Medications: Current Facility-Administered Medications  Medication Dose Route Frequency Provider Last Rate Last Dose  . acetaminophen (TYLENOL) tablet 650 mg  650 mg Oral Q6H PRN Charm RingsLord, Jamison Y, NP   650 mg at 08/20/18 1412  . alum & mag hydroxide-simeth (MAALOX/MYLANTA) 200-200-20 MG/5ML suspension 30 mL  30 mL Oral Q4H PRN Charm RingsLord, Jamison Y, NP      . ARIPiprazole (ABILIFY) tablet 5 mg  5 mg Oral QHS Cobos, Fernando A, MD      . citalopram (CELEXA) tablet 10 mg  10 mg Oral Daily Cobos, Rockey SituFernando A, MD   10 mg at 08/22/18 0738  . magnesium hydroxide (MILK OF MAGNESIA) suspension 30 mL  30 mL Oral Daily PRN Charm RingsLord, Jamison Y, NP      . norethindrone-ethinyl estradiol-iron (MICROGESTIN FE,GILDESS FE,LOESTRIN FE) 1.5-30 MG-MCG tablet 1 tablet  1 tablet Oral Daily Charm RingsLord, Jamison Y, NP      . ondansetron (ZOFRAN-ODT) disintegrating tablet 4 mg  4 mg Oral Q8H PRN Oneta RackLewis, Tanika N, NP   4 mg at 08/20/18 1725  . traZODone (DESYREL) tablet 25 mg  25 mg Oral QHS PRN Aldean BakerSykes, Janiylah Hannis E, NP        Lab Results: No results found for this or any previous visit (from the past 48 hour(s)).  Blood Alcohol level:  No results found for: [facility]  Metabolic Disorder Labs: Lab Results  Component Value Date   HGBA1C 5.3 07/10/2018   MPG 114 12/20/2016   Lab Results  Component Value Date   PROLACTIN 13.4 08/05/2014   Lab Results  Component Value Date   CHOL 144 03/30/2017   TRIG 88 03/30/2017   HDL 54 03/30/2017   CHOLHDL 2.7 03/30/2017   VLDL  23 12/20/2016   LDLCALC 73 03/30/2017   LDLCALC 76 12/20/2016    Physical Findings: AIMS: Facial and Oral Movements Muscles of Facial Expression: None, normal Lips and Perioral Area: None, normal Jaw: None, normal Tongue: None, normal,Extremity Movements Upper (arms, wrists, hands, fingers): None, normal Lower (legs, knees, ankles, toes): None, normal, Trunk Movements Neck, shoulders, hips: None, normal, Overall Severity Severity of abnormal movements (highest score from questions above): None, normal Incapacitation due to abnormal movements: None, normal Patient's awareness of abnormal movements (rate only patient's report): No Awareness, Dental Status Current problems with teeth and/or dentures?: No Does patient usually wear dentures?: No  CIWA:    COWS:  Musculoskeletal: Strength & Muscle Tone: within normal limits Gait & Station: normal Patient leans: N/A  Psychiatric Specialty Exam: Physical Exam  Nursing note and vitals reviewed. Constitutional: He is oriented to person, place, and time. He appears well-developed and well-nourished.  Cardiovascular: Normal rate.  Respiratory: Effort normal.  Neurological: He is alert and oriented to person, place, and time.    Review of Systems  Constitutional: Negative.   Respiratory: Negative.   Cardiovascular: Negative.   Gastrointestinal: Positive for nausea and vomiting. Negative for abdominal pain and diarrhea.  Psychiatric/Behavioral: Positive for depression (improving). Negative for hallucinations, memory loss, substance abuse and suicidal ideas. The patient is not nervous/anxious and does not have insomnia.     Blood pressure 117/68, pulse 89, temperature 97.7 F (36.5 C), temperature source Oral, resp. rate 16, height 5\' 1"  (1.549 m), weight 85.7 kg.Body mass index is 35.71 kg/m.  General Appearance: Casual  Eye Contact:  Fair  Speech:  Normal Rate  Volume:  Normal  Mood:  Euthymic  Affect:  Congruent and Full  Range  Thought Process:  Coherent  Orientation:  Full (Time, Place, and Person)  Thought Content:  WDL  Suicidal Thoughts:  No  Homicidal Thoughts:  No  Memory:  Immediate;   Good Recent;   Good  Judgement:  Fair  Insight:  Fair  Psychomotor Activity:  Normal  Concentration:  Concentration: Good  Recall:  Good  Fund of Knowledge:  Fair  Language:  Good  Akathisia:  No  Handed:  Right  AIMS (if indicated):     Assets:  Communication Skills Desire for Improvement Housing Resilience Social Support  ADL's:  Intact  Cognition:  WNL  Sleep:  Number of Hours: 6.75     Treatment Plan Summary: Daily contact with patient to assess and evaluate symptoms and progress in treatment and Medication management   Continue inpatient hospitalization.  Check serum amylase, lipase, LFTs  Continue Abilify 5 mg PO QHS for mood Continue Celexa 10 mg PO daily for mood Continue oral contraceptive for birth control Continue Zofran ODT 4 mg PO Q8HR PRN N/V Decrease trazodone to 25 mg PO QHS PRN insomnia  Patient will participate in the therapeutic group milieu.  Discharge disposition in progress.   Aldean Baker, NP 08/22/2018, 11:10 AM

## 2018-08-22 NOTE — Plan of Care (Signed)
  Problem: Education: Goal: Knowledge of [facility] Education information/materials will improve Outcome: Progressing   Problem: Activity: Goal: Interest or engagement in activities will improve Outcome: Progressing   Problem: Health Behavior/Discharge Planning: Goal: Compliance with treatment plan for underlying cause of condition will improve Outcome: Progressing   Problem: Safety: Goal: Periods of time without injury will increase Outcome: Progressing   

## 2018-08-23 MED ORDER — ZOLPIDEM TARTRATE 5 MG PO TABS
5.0000 mg | ORAL_TABLET | Freq: Every evening | ORAL | Status: DC | PRN
  Administered 2018-08-23: 5 mg via ORAL
  Filled 2018-08-23: qty 1

## 2018-08-23 NOTE — Progress Notes (Signed)
[facility] MD Progress Note  08/23/2018 10:25 AM FOLASHADE WINCEK  MRN:  [id number] Subjective: Patient is a 18 year old transgender female to female patient who was admitted on 08/20/2018 with worsening depression and anxiety.  He is identified suicidal ideation, self-harm, intermittent auditory hallucinations, anxiety and hypercritical in her thoughts.  Objective: Patient is seen and examined.  Patient is a 18 year old transgender female to female who is seen in follow-up.  She is sedated this morning.  We had reduced her trazodone to 25 mg last night, but it looks like it is still causing some oversedation.  She stated splitting up the Celexa and Abilify to 1 in the AM and 1 at bedtime did assist in the nausea.  We discussed today stopping the trazodone and substituting something else.  She stated her mood was better and she felt better.  She stated that her anxiety had decreased as well.  She denied any side effects to her current medication outside of oversedation.  Her vital signs are stable, she is afebrile.  She slept 7 hours last night.  Principal Problem: Major depressive disorder, recurrent episode, severe (HCC) Diagnosis: Principal Problem:   Major depressive disorder, recurrent episode, severe (HCC)  Total Time spent with patient: 20 minutes  Past Psychiatric History: See admission H&P  Past Medical History:  Past Medical History:  Diagnosis Date  . Anxiety   . Asthma   . Asthma 04/24/2015  . 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 Congenital heart problem    blockage  . Headache   . Obesity   . Panic attacks 04/24/2015  . RBBB (right bundle branch block) 04/24/2015  . Social anxiety disorder 04/24/2015    Past Surgical History:  Procedure Laterality Date  . MOUTH SURGERY  05/04/2017   Wisdom teeth ectraction Bilateral    Family History:  Family History  Problem Relation Age of Onset  . Lupus Mother   . Heart disease Father    Family Psychiatric  History: See admission  H&P Social History:  Social History   Substance and Sexual Activity  Alcohol Use No     Social History   Substance and Sexual Activity  Drug Use No    Social History   Socioeconomic History  . Marital status: Single    Spouse name: Not on file  . Number of children: Not on file  . Years of education: 49  . Highest education level: 12th grade  Occupational History  . Not on file  Social Needs  . Financial resource strain: Not on file  . Food insecurity:    Worry: Patient refused    Inability: Patient refused  . Transportation needs:    Medical: Patient refused    Non-medical: Patient refused  Tobacco Use  . Smoking status: Never Smoker  . Smokeless tobacco: Never Used  Substance and Sexual Activity  . Alcohol use: No  . Drug use: No  . Sexual activity: Not on file  Lifestyle  . Physical activity:    Days per week: Patient refused    Minutes per session: Patient refused  . Stress: Not on file  Relationships  . Social connections:    Talks on phone: Patient refused    Gets together: Patient refused    Attends religious service: Patient refused    Active member of club or organization: Patient refused    Attends meetings of clubs or organizations: Patient refused    Relationship status: Patient refused  Other Topics Concern  . Not on file  Social History Narrative   [name] lives with her mom and stepfather, 19 years old brother.  Mom works in housekeeping.  [name] graduated from [employer]. Works at [employer] on [address]    Additional Social History:                         Sleep: Good  Appetite:  Fair  Current Medications: Current Facility-Administered Medications  Medication Dose Route Frequency Provider Last Rate Last Dose  . acetaminophen (TYLENOL) tablet 650 mg  650 mg Oral Q6H PRN Charm Rings, NP   650 mg at 08/20/18 1412  . alum & mag hydroxide-simeth (MAALOX/MYLANTA) 200-200-20 MG/5ML suspension 30 mL  30 mL Oral Q4H PRN  Charm Rings, NP      . ARIPiprazole (ABILIFY) tablet 5 mg  5 mg Oral QHS Cobos, Rockey Situ, MD   5 mg at 08/22/18 2218  . citalopram (CELEXA) tablet 10 mg  10 mg Oral Daily Cobos, Rockey Situ, MD   10 mg at 08/23/18 0742  . magnesium hydroxide (MILK OF MAGNESIA) suspension 30 mL  30 mL Oral Daily PRN Charm Rings, NP      . norethindrone-ethinyl estradiol-iron (MICROGESTIN FE,GILDESS FE,LOESTRIN FE) 1.5-30 MG-MCG tablet 1 tablet  1 tablet Oral Daily Lord, Jamison Y, NP      . ondansetron (ZOFRAN-ODT) disintegrating tablet 4 mg  4 mg Oral Q8H PRN Oneta Rack, NP   4 mg at 08/20/18 1725  . zolpidem (AMBIEN) tablet 5 mg  5 mg Oral QHS PRN Antonieta Pert, MD        Lab Results:  Results for orders placed or performed during the hospital encounter of 08/19/18 (from the past 48 hour(s))  Amylase     Status: None   Collection Time: 08/22/18  6:41 PM  Result Value Ref Range   Amylase 53 28 - 100 U/L    Comment: Performed at [facility], [address], [place], [place] [address]  Lipase, blood     Status: None   Collection Time: 08/22/18  6:41 PM  Result Value Ref Range   Lipase 42 11 - 51 U/L    Comment: Performed at [facility], [address] [place], [place] [address]  Hepatic function panel     Status: None   Collection Time: 08/22/18  6:41 PM  Result Value Ref Range   Total Protein 7.8 6.5 - 8.1 g/dL   Albumin 4.6 3.5 - 5.0 g/dL   AST 24 15 - 41 U/L   ALT 13 0 - 44 U/L   Alkaline Phosphatase 44 38 - 126 U/L   Total Bilirubin 0.5 0.3 - 1.2 mg/dL   Bilirubin, Direct <1.9 0.0 - 0.2 mg/dL   Indirect Bilirubin NOT CALCULATED 0.3 - 0.9 mg/dL    Comment: Performed at [facility], [address], [place], [place] [address]    Blood Alcohol level:  No results found for: [facility]  Metabolic Disorder Labs: Lab Results  Component Value Date   HGBA1C 5.3 07/10/2018   MPG 114 12/20/2016   Lab Results  Component Value  Date   PROLACTIN 13.4 08/05/2014   Lab Results  Component Value Date   CHOL 144 03/30/2017   TRIG 88 03/30/2017   HDL 54 03/30/2017   CHOLHDL 2.7 03/30/2017   VLDL 23 12/20/2016   LDLCALC 73 03/30/2017   LDLCALC 76 12/20/2016    Physical Findings: AIMS: Facial  and Oral Movements Muscles of Facial Expression: None, normal Lips and Perioral Area: None, normal Jaw: None, normal Tongue: None, normal,Extremity Movements Upper (arms, wrists, hands, fingers): None, normal Lower (legs, knees, ankles, toes): None, normal, Trunk Movements Neck, shoulders, hips: None, normal, Overall Severity Severity of abnormal movements (highest score from questions above): None, normal Incapacitation due to abnormal movements: None, normal Patient's awareness of abnormal movements (rate only patient's report): No Awareness, Dental Status Current problems with teeth and/or dentures?: No Does patient usually wear dentures?: No  CIWA:    COWS:     Musculoskeletal: Strength & Muscle Tone: within normal limits Gait & Station: normal Patient leans: N/A  Psychiatric Specialty Exam: Physical Exam  Nursing note and vitals reviewed. Constitutional: He is oriented to person, place, and time. He appears well-developed and well-nourished.  HENT:  Head: Normocephalic and atraumatic.  Respiratory: Effort normal.  Neurological: He is alert and oriented to person, place, and time.    ROS  Blood pressure 122/65, pulse 77, temperature (!) 97.5 F (36.4 C), temperature source Oral, resp. rate 16, height 5\' 1"  (1.549 m), weight 85.7 kg.Body mass index is 35.71 kg/m.  General Appearance: Disheveled  Eye Contact:  Fair  Speech:  Normal Rate  Volume:  Decreased  Mood:  Euthymic  Affect:  Constricted  Thought Process:  Coherent and Descriptions of Associations: Intact  Orientation:  Full (Time, Place, and Person)  Thought Content:  Logical  Suicidal Thoughts:  No  Homicidal Thoughts:  No  Memory:   Immediate;   Fair Recent;   Fair Remote;   Fair  Judgement:  Intact  Insight:  Fair  Psychomotor Activity:  Psychomotor Retardation  Concentration:  Concentration: Fair and Attention Span: Fair  Recall:  [employer] of Knowledge:  Fair  Language:  Good  Akathisia:  Negative  Handed:  Right  AIMS (if indicated):     Assets:  Communication Skills Desire for Improvement Financial Resources/Insurance Housing Physical Health Resilience  ADL's:  Intact  Cognition:  WNL  Sleep:  Number of Hours: 6.75     Treatment Plan Summary: Daily contact with patient to assess and evaluate symptoms and progress in treatment, Medication management and Plan : Patient is seen and examined.  Patient is a 18 year old female to female transgender patient who is seen in follow-up.  Except for the hangover from the trazodone she continues to slowly improve.  She stated her mood and anxiety were under better control, and that she was not suicidal.  We will stop the trazodone, and I will add 5 mg of Ambien as needed nightly.  No other changes in her medications.  Hopefully if things go well today we can anticipate discharge in 1 to 2 days. 1.  Stop trazodone 2.  Ambien 5 mg p.o. nightly as needed insomnia. 3.  Continue Celexa 10 mg p.o. daily for mood and anxiety. 4.  Continue Abilify 5 mg p.o. nightly for mood stability. 5.  No change in hormonal therapy for transgender issues. 6.  Disposition planning-in progress, anticipate discharge in 1 to 2 days.  Antonieta Pert, MD 08/23/2018, 10:25 AM

## 2018-08-23 NOTE — Progress Notes (Signed)
D: Pt alert and oriented on the unit. Pt animated and engaging with RN staff and other pts. Pt denies SI/HI, A/VH. Pt also participated during unit groups and activities. Pt is pleasant and cooperative. Pt rated her depression, anxiety and feelings of  Hopelessness  All a 0, with 10 being the worse. Pt's goal for today is "focus on myself and try to make myself happy, also talk to the doctor."  A: Education, support and encouragement provided, q15 minute safety checks remain in effect. Medications administered per MD orders.  R: No reactions/side effects to medicine noted. Pt denies any concerns at this time, and verbally contracts for safety. Pt ambulating on the unit with no issues. Pt remains safe on and off the unit.

## 2018-08-23 NOTE — Progress Notes (Signed)
Pt attended wrap-up group. Pt appears animated/anxious in affect and mood. Pt denies SI/HI/AVH/Pain at this time. Pt is quiet in the milieu; stays to himself. Ambien was started tonight. Support offered. Will continue with POC

## 2018-08-23 NOTE — Plan of Care (Signed)
  Problem: Coping: Goal: Coping ability will improve Outcome: Progressing   Problem: Medication: Goal: Compliance with prescribed medication regimen will improve Outcome: Progressing   

## 2018-08-24 MED ORDER — ARIPIPRAZOLE 5 MG PO TABS: 5.0000 mg | ORAL_TABLET | Freq: Every day | ORAL | 0 refills | Status: DC

## 2018-08-24 MED ORDER — CITALOPRAM HYDROBROMIDE 10 MG PO TABS: 10.0000 mg | ORAL_TABLET | Freq: Every day | ORAL | 0 refills | Status: DC

## 2018-08-24 MED ORDER — ZOLPIDEM TARTRATE 5 MG PO TABS: 5.0000 mg | ORAL_TABLET | Freq: Every evening | ORAL | 0 refills | Status: DC | PRN

## 2018-08-24 NOTE — Progress Notes (Signed)
  [facility] Adult Case Management Discharge Plan :  Will you be returning to the same living situation after discharge:  Yes,  patient reports she is returning home with her mother At discharge, do you have transportation home?: Yes,  patient reports her mother is picking her up at discharge Do you have the ability to pay for your medications: Yes,  income from employment, Medicaid   Release of information consent forms completed and in the chart;  Patient's signature needed at discharge.  Patient to Follow up at: Follow-up Information    [facility] Go on 08/29/2018.   Specialty:  Behavioral Health Why:  Your next appointment with your therapist Irving Burton is  08/29/18 at 1:00pm. Be sure to bring any discharge paperwork from this hospitalization.   Contact information: [address] [place] [address] [phone]        [facility] Follow up on 09/14/2018.   Specialty:  Behavioral Health Why:  Medication management appointment is Thursday, 3/19 at 11:00a.  Please bring your photo ID, proof of insurance, current medications and discharge paperwork from this hospitalization.  Contact information: [address] [place] [address] [phone]           Next level of care provider has access to [facility] Link:yes  Safety Planning and Suicide Prevention discussed: Yes,  with the patient's girlfriend  Have you used any form of tobacco in the last 30 days? (Cigarettes, Smokeless Tobacco, Cigars, and/or Pipes): No  Has patient been referred to the Quitline?: N/A patient is not a smoker  Patient has been referred for addiction treatment: N/A  Maeola Sarah, LCSWA 08/24/2018, 10:15 AM

## 2018-08-24 NOTE — Progress Notes (Signed)
Pt discharged to lobby. Pt was stable and appreciative at that time. All papers and prescriptions were given and valuables returned. Verbal understanding expressed. Denies SI/HI and A/VH. Pt given opportunity to express concerns and ask questions.  

## 2018-08-24 NOTE — [facility] Suicide Risk Assessment (Signed)
[facility] Discharge Suicide Risk Assessment   Principal Problem: Major depressive disorder, recurrent episode, severe (HCC) Discharge Diagnoses: Principal Problem:   Major depressive disorder, recurrent episode, severe (HCC)   Total Time spent with patient: 15 minutes  Musculoskeletal: Strength & Muscle Tone: within normal limits Gait & Station: normal Patient leans: N/A  Psychiatric Specialty Exam: Review of Systems  All other systems reviewed and are negative.   Blood pressure 122/65, pulse 77, temperature (!) 97.5 F (36.4 C), temperature source Oral, resp. rate 16, height 5\' 1"  (1.549 m), weight 85.7 kg.Body mass index is 35.71 kg/m.  General Appearance: Casual  Eye Contact::  Fair  Speech:  Normal Rate409  Volume:  Normal  Mood:  Euthymic  Affect:  Congruent  Thought Process:  Coherent and Descriptions of Associations: Intact  Orientation:  Full (Time, Place, and Person)  Thought Content:  Logical  Suicidal Thoughts:  No  Homicidal Thoughts:  No  Memory:  Immediate;   Fair Recent;   Fair Remote;   Fair  Judgement:  Intact  Insight:  Fair  Psychomotor Activity:  Normal  Concentration:  Fair  Recall:  [employer] of Knowledge:Good  Language: Good  Akathisia:  Negative  Handed:  Right  AIMS (if indicated):     Assets:  Communication Skills Desire for Improvement Financial Resources/Insurance Housing Physical Health Resilience Social Support  Sleep:  Number of Hours: 6.25  Cognition: WNL  ADL's:  Intact   Mental Status Per Nursing Assessment::   On Admission:  Suicidal ideation indicated by patient, Self-harm thoughts, Self-harm behaviors  Demographic Factors:  Female, Adolescent or young adult and Gay, lesbian, or bisexual orientation  Loss Factors: NA  Historical Factors: Impulsivity  Risk Reduction Factors:   Sense of responsibility to family, Living with another person, especially a relative and Positive therapeutic relationship  Continued Clinical  Symptoms:  Depression:   Impulsivity Previous Psychiatric Diagnoses and Treatments  Cognitive Features That Contribute To Risk:  None    Suicide Risk:  Minimal: No identifiable suicidal ideation.  Patients presenting with no risk factors but with morbid ruminations; may be classified as minimal risk based on the severity of the depressive symptoms  Follow-up Information    [facility]. Go on 08/29/2018.   Specialty:  Behavioral Health Why:  Your next appointment with therapist Kara Pacer is  3/3 at 1:00pm.  Contact information: [address] [place] [address] [phone]        [facility] Follow up.   Specialty:  Behavioral Health Why:  Medication management appointment is  Please bring your photo ID, proof of insurance, SSN, current medications and discharge paperwork from this hospitalization.  Contact information: [address] [place] [address] [phone]           Plan Of Care/Follow-up recommendations:  Activity:  ad lib  Antonieta Pert, MD 08/24/2018, 7:56 AM

## 2018-08-24 NOTE — Discharge Summary (Signed)
Physician Discharge Summary Note  Patient:  Diane Mann is an 18 y.o., adult MRN:  [id number] DOB:  [birth year] Patient phone:  [phone] (home)  Patient address:   [address] [place] [id number],  Total Time spent with patient: 15 minutes  Date of Admission:  08/19/2018 Date of Discharge: 08/24/2018  Reason for Admission:  Suicidal ideation  Principal Problem: Major depressive disorder, recurrent episode, severe (HCC) Discharge Diagnoses: Principal Problem:   Major depressive disorder, recurrent episode, severe (HCC)   Past Psychiatric History: Per admission H&P: one prior psychiatric admission in 2016, for depression, suicidal attempt by overdose, self cutting. Reports history of intermittent auditory hallucinations. Does not endorse any clear history of mania or hypomania, does describe brief episodes of explosiveness during which she sometimes throws objects or punches dry wall, of short duration, suggestive of intermittent explosiveness . Endorses history of PTSD symptoms, as above. Endorses history of Panic Attacks, with some agoraphobia.  Past Medical History:  Past Medical History:  Diagnosis Date  . Anxiety   . Asthma   . Asthma 04/24/2015  . 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 Congenital heart problem    blockage  . Headache   . Obesity   . Panic attacks 04/24/2015  . RBBB (right bundle branch block) 04/24/2015  . Social anxiety disorder 04/24/2015    Past Surgical History:  Procedure Laterality Date  . MOUTH SURGERY  05/04/2017   Wisdom teeth ectraction Bilateral    Family History:  Family History  Problem Relation Age of Onset  . Lupus Mother   . Heart disease Father    Family Psychiatric  History: Per admission H&P: reports she thinks her father has bipolar disorder , has history of alcohol abuse . No suicides in family. Social History:  Social History   Substance and Sexual Activity  Alcohol Use No     Social History   Substance and  Sexual Activity  Drug Use No    Social History   Socioeconomic History  . Marital status: Single    Spouse name: Not on file  . Number of children: Not on file  . Years of education: 2712  . Highest education level: 12th grade  Occupational History  . Not on file  Social Needs  . Financial resource strain: Not on file  . Food insecurity:    Worry: Patient refused    Inability: Patient refused  . Transportation needs:    Medical: Patient refused    Non-medical: Patient refused  Tobacco Use  . Smoking status: Never Smoker  . Smokeless tobacco: Never Used  Substance and Sexual Activity  . Alcohol use: No  . Drug use: No  . Sexual activity: Not on file  Lifestyle  . Physical activity:    Days per week: Patient refused    Minutes per session: Patient refused  . Stress: Not on file  Relationships  . Social connections:    Talks on phone: Patient refused    Gets together: Patient refused    Attends religious service: Patient refused    Active member of club or organization: Patient refused    Attends meetings of clubs or organizations: Patient refused    Relationship status: Patient refused  Other Topics Concern  . Not on file  Social History Narrative   Diane Mann lives with her mom and stepfather, 20 years old brother.  Mom works in housekeeping.  Diane Mann graduated from [employer]. Works at [employer] on [employer]     Hospital  Course:  Per admission H&P 08/20/2018: 18 year old , identifies self as transgender female, requesting to be called " Diane Mann". Reports worsening depression and anxiety. States has chronic depression which waxes and wanes but has been worsening over recent weeks to months, which he attributes in part to a negative work environment and strained relationship with family, partly because they do not support transgender status. Endorses neuro-vegetative symptoms of depression as below, and describes recent suicidal ideations, without plan or intention. After  a long period of not engaging in self cutting, did recently cut self earlier this week, with the purpose of addressing negative affects . Reports intermittent auditory hallucinations, mainly during periods of severe anxiety, which he describes as whispers , making hyper-critical, insulting comments .  Diane Mann was admitted for depression with suicidal ideation. He was started on Celexa for depression, along with Abilify for depression and auditory hallucinations. He was started on PRN trazodone at HS but had daytime somnolence even with a 25 mg dose. PRN Trazodone was discontinued and PRN Ambien was started. Abilify was changed to an HS admin time due to sedation. He had some nausea initially with Abilify and Celexa but this improved when Abilify was changed to HS. He remained on the [facility] unit for 4 days. He participated in group therapy on the unit. He stabilized with medication and therapy. He was discharged on the medications listed below. He has shown improvement with improved mood, affect, sleep, appetite, and interaction. He denies any SI/HI/AVH and contracts for safety. He agrees to follow up at [facility] and [facility] (see below). Patient is provided with prescriptions for medications upon discharge. His mother is picking him up for discharge home.  Physical Findings: AIMS: Facial and Oral Movements Muscles of Facial Expression: None, normal Lips and Perioral Area: None, normal Jaw: None, normal Tongue: None, normal,Extremity Movements Upper (arms, wrists, hands, fingers): None, normal Lower (legs, knees, ankles, toes): None, normal, Trunk Movements Neck, shoulders, hips: None, normal, Overall Severity Severity of abnormal movements (highest score from questions above): None, normal Incapacitation due to abnormal movements: None, normal Patient's awareness of abnormal movements (rate only patient's report): No Awareness, Dental Status Current  problems with teeth and/or dentures?: No Does patient usually wear dentures?: No  CIWA:    COWS:     Musculoskeletal: Strength & Muscle Tone: within normal limits Gait & Station: normal Patient leans: N/A  Psychiatric Specialty Exam: Physical Exam  Nursing note and vitals reviewed. Constitutional: He is oriented to person, place, and time. He appears well-developed and well-nourished.  Cardiovascular: Normal rate.  Respiratory: Effort normal.  Neurological: He is alert and oriented to person, place, and time.    Review of Systems  Constitutional: Negative.   Psychiatric/Behavioral: Positive for depression (improving). Negative for hallucinations, memory loss, substance abuse and suicidal ideas. The patient has insomnia (improved with medication). The patient is not nervous/anxious.     Blood pressure 116/75, pulse 78, temperature 98.3 F (36.8 C), resp. rate 18, height 5\' 1"  (1.549 m), weight 85.7 kg.Body mass index is 35.71 kg/m.  See MD's discharge SRA     Have you used any form of tobacco in the last 30 days? (Cigarettes, Smokeless Tobacco, Cigars, and/or Pipes): No  Has this patient used any form of tobacco in the last 30 days? (Cigarettes, Smokeless Tobacco, Cigars, and/or Pipes)  No  Blood Alcohol level:  No results found for: [facility]  Metabolic Disorder Labs:  Lab Results  Component Value  Date   HGBA1C 5.3 07/10/2018   MPG 114 12/20/2016   Lab Results  Component Value Date   PROLACTIN 13.4 08/05/2014   Lab Results  Component Value Date   CHOL 144 03/30/2017   TRIG 88 03/30/2017   HDL 54 03/30/2017   CHOLHDL 2.7 03/30/2017   VLDL 23 12/20/2016   LDLCALC 73 03/30/2017   LDLCALC 76 12/20/2016    See Psychiatric Specialty Exam and Suicide Risk Assessment completed by Attending Physician prior to discharge.  Discharge destination:  Home  Is patient on multiple antipsychotic therapies at discharge:  No   Has Patient had three or more failed trials of  antipsychotic monotherapy by history:  No  Recommended Plan for Multiple Antipsychotic Therapies: NA  Discharge Instructions    Discharge instructions   Complete by:  As directed    Patient is instructed to take all prescribed medications as recommended. Report any side effects or adverse reactions to your outpatient psychiatrist. Patient is instructed to abstain from alcohol and illegal drugs while on prescription medications. In the event of worsening symptoms, patient is instructed to call the crisis hotline, 911, or go to the nearest emergency department for evaluation and treatment.     Allergies as of 08/24/2018   No Known Allergies     Medication List    STOP taking these medications   Cetirizine HCl 10 MG Caps   diclofenac sodium 1 % Gel Commonly known as:  VOLTAREN   ibuprofen 800 MG tablet Commonly known as:  ADVIL,MOTRIN     TAKE these medications     Indication  acetaminophen 325 MG tablet Commonly known as:  TYLENOL Take 650 mg by mouth every 6 (six) hours as needed for mild pain or headache.  Indication:  Pain   albuterol 108 (90 Base) MCG/ACT inhaler Commonly known as:  PROVENTIL HFA;VENTOLIN HFA Inhale into the lungs every 6 (six) hours as needed for wheezing or shortness of breath.  Indication:  Asthma   ARIPiprazole 5 MG tablet Commonly known as:  ABILIFY Take 1 tablet (5 mg total) by mouth at bedtime. For mood  Indication:  Mood   citalopram 10 MG tablet Commonly known as:  CELEXA Take 1 tablet (10 mg total) by mouth daily. For mood Start taking on:  August 25, 2018  Indication:  Mood   norethindrone-ethinyl estradiol-iron 1.5-30 MG-MCG tablet Commonly known as:  MICROGESTIN FE 1.5/30 Take 1 tablet by mouth daily. Skip placebo pills x 2 months. On the third month- take the placebo (brown) pills.  Indication:  Birth Control Treatment   zolpidem 5 MG tablet Commonly known as:  AMBIEN Take 1 tablet (5 mg total) by mouth at bedtime as needed  for sleep.  Indication:  Trouble Sleeping      Follow-up Information    [facility] Go on 08/29/2018.   Specialty:  Behavioral Health Why:  Your next appointment with your therapist Irving Burton is  08/29/18 at 1:00pm. Be sure to bring any discharge paperwork from this hospitalization.   Contact information: [address] [place] [address] [phone]        [facility] Follow up on 09/14/2018.   Specialty:  Behavioral Health Why:  Medication management appointment is Thursday, 3/19 at 11:00a.  Please bring your photo ID, proof of insurance, current medications and discharge paperwork from this hospitalization.  Contact information: [address] [place] [address] [phone]  Follow-up recommendations: Activity as tolerated. Diet as recommended by primary care physician. Keep all scheduled follow-up appointments as recommended.   Comments:   Patient is instructed to take all prescribed medications as recommended. Report any side effects or adverse reactions to your outpatient psychiatrist. Patient is instructed to abstain from alcohol and illegal drugs while on prescription medications. In the event of worsening symptoms, patient is instructed to call the crisis hotline, 911, or go to the nearest emergency department for evaluation and treatment.  Signed: Aldean Baker, NP 08/24/2018, 2:23 PM

## 2018-08-24 NOTE — BHH Group Notes (Signed)
[facility] Mental Health Association Group Therapy      08/24/2018 10:43 AM  Type of Therapy: Mental Health Association Presentation  Participation Level: Active  Participation Quality: Attentive  Affect: Appropriate  Cognitive: Oriented  Insight: Developing/Improving  Engagement in Therapy: Engaged  Modes of Intervention: Discussion, Education and Socialization  Summary of Progress/Problems: Mental Health Association (MHA) Speaker came to talk about his personal journey with mental health. The pt processed ways by which to relate to the speaker. MHA speaker provided handouts and educational information pertaining to groups and services offered by the [facility]. Pt was engaged in speaker's presentation and was receptive to resources provided.    Alcario Drought Clinical Social Worker

## 2018-09-03 ENCOUNTER — Encounter (HOSPITAL_COMMUNITY): Payer: Self-pay

## 2018-09-03 ENCOUNTER — Ambulatory Visit (HOSPITAL_COMMUNITY)
Admission: EM | Admit: 2018-09-03 | Discharge: 2018-09-03 | Disposition: A | Discharge status: Home / Self Care | Payer: Medicaid Other | Attending: Family Medicine | Admitting: Family Medicine

## 2018-09-03 ENCOUNTER — Other Ambulatory Visit: Payer: Self-pay

## 2018-09-03 DIAGNOSIS — R110 Nausea: Secondary | ICD-10-CM | POA: Diagnosis not present

## 2018-09-03 MED ORDER — ONDANSETRON 4 MG PO TBDP
4.0000 mg | ORAL_TABLET | Freq: Once | ORAL | Status: AC
  Administered 2018-09-03: 4 mg via ORAL

## 2018-09-03 MED ORDER — ONDANSETRON HCL 4 MG PO TABS: 4.0000 mg | ORAL_TABLET | Freq: Three times a day (TID) | ORAL | 0 refills | Status: DC | PRN

## 2018-09-03 MED ORDER — ONDANSETRON 4 MG PO TBDP
ORAL_TABLET | ORAL | Status: AC
  Filled 2018-09-03: qty 1

## 2018-09-03 NOTE — ED Provider Notes (Signed)
[facility]    CSN: [id number] Arrival date & time: 09/03/18  1001     History   Chief Complaint Chief Complaint  Patient presents with  . Hot Flashes  . Nausea    HPI Diane Mann is a 18 y.o. adult.   Diane Mann presents with family with complaints of subjective fever this morning while at work as well as nausea. No vomiting. No diarrhea. No abdominal pain. Still with mild nausea. Some congestion and sore throat. No headache. No cough. No rash. No known ill contacts. Hasn't taken any medications for symptoms. Denies any previous similar. Hasn't eaten today. Ate at [employer] last night. States had last BM two days ago which was normal. Denies urinary symptoms. No others who ate at the buffet with her are ill. Irregular periods and doesn't recall her last period, this is normal for her. She states she is not sexually active. Without contributing medical history.      ROS per HPI, negative if not otherwise mentioned.      Past Medical History:  Diagnosis Date  . Anxiety   . Asthma   . Asthma 04/24/2015  . 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 Congenital heart problem    blockage  . Headache   . Obesity   . Panic attacks 04/24/2015  . RBBB (right bundle branch block) 04/24/2015  . Social anxiety disorder 04/24/2015    Patient Active Problem List   Diagnosis Date Noted  . Major depressive disorder, recurrent episode, severe (HCC) 08/19/2018  . 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 Social anxiety disorder 04/24/2015  . Panic attacks 04/24/2015  . RBBB (right bundle branch block) 04/24/2015  . 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 Asthma 04/24/2015  . Oligomenorrhea 04/09/2015  . Thyroid function test abnormal 08/05/2014  . 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 Obesity 08/05/2014  . Short stature 08/05/2014    Past Surgical History:  Procedure Laterality Date  . MOUTH SURGERY   05/04/2017   Wisdom teeth ectraction Bilateral     OB History   No obstetric history on file.      Home Medications    Prior to Admission medications   Medication Sig Start Date End Date Taking? Authorizing Provider  ARIPiprazole (ABILIFY) 5 MG tablet Take 1 tablet (5 mg total) by mouth at bedtime. For mood 08/24/18  Yes Aldean BakerSykes, Janet E, NP  citalopram (CELEXA) 10 MG tablet Take 1 tablet (10 mg total) by mouth daily. For mood 08/25/18  Yes Aldean BakerSykes, Janet E, NP  norethindrone-ethinyl estradiol-iron (MICROGESTIN FE 1.5/30) 1.5-30 MG-MCG tablet Take 1 tablet by mouth daily. Skip placebo pills x 2 months. On the third month- take the placebo (brown) pills. 07/10/18  Yes Dessa PhiBadik, Jennifer, MD  acetaminophen (TYLENOL) 325 MG tablet Take 650 mg by mouth every 6 (six) hours as needed for mild pain or headache.    [provider]  albuterol (PROVENTIL HFA;VENTOLIN HFA) 108 (90 Base) MCG/ACT inhaler Inhale into the lungs every 6 (six) hours as needed for wheezing or shortness of breath.    [provider]  ondansetron (ZOFRAN) 4 MG tablet Take 1 tablet (4 mg total) by mouth every 8 (eight) hours as needed for nausea or vomiting. 09/03/18   Georgetta HaberBurky, Godric Lavell B, NP  zolpidem (AMBIEN) 5 MG tablet Take 1 tablet (5 mg total) by mouth at bedtime as needed for sleep. 08/24/18   Marciano SequinSykes, Janet  E, NP    Family History Family History  Problem Relation Age of Onset  . Lupus Mother   . Heart disease Father     Social History Social History   Tobacco Use  . Smoking status: Never Smoker  . Smokeless tobacco: Never Used  Substance Use Topics  . Alcohol use: No  . Drug use: No     Allergies   Patient has no known allergies.   Review of Systems Review of Systems   Physical Exam Triage Vital Signs ED Triage Vitals  Enc Vitals Group     BP 09/03/18 1009 123/83     Pulse Rate 09/03/18 1009 75     Resp 09/03/18 1009 17     Temp 09/03/18 1009 98.1 F (36.7 C)     Temp Source 09/03/18  1009 Oral     SpO2 09/03/18 1009 99 %     Weight --      Height --      Head Circumference --      Peak Flow --      Pain Score 09/03/18 1011 0     Pain Loc --      Pain Edu? --      Excl. in GC? --    No data found.  Updated Vital Signs BP 123/83 (BP Location: Left Arm)   Pulse 75   Temp 98.1 F (36.7 C) (Oral)   Resp 17   SpO2 99%   Visual Acuity Right Eye Distance:   Left Eye Distance:   Bilateral Distance:    Right Eye Near:   Left Eye Near:    Bilateral Near:     Physical Exam Constitutional:      General: He is not in acute distress.    Appearance: He is well-developed.  HENT:     Nose: Nose normal.     Mouth/Throat:     Mouth: Mucous membranes are moist.  Cardiovascular:     Rate and Rhythm: Normal rate and regular rhythm.     Heart sounds: Normal heart sounds.  Pulmonary:     Effort: Pulmonary effort is normal.     Breath sounds: Normal breath sounds.  Abdominal:     General: Abdomen is flat. Bowel sounds are normal. There is no distension.     Palpations: There is no mass.     Tenderness: There is no abdominal tenderness. There is no guarding or rebound.  Skin:    General: Skin is warm and dry.  Neurological:     Mental Status: He is alert and oriented to person, place, and time.      UC Treatments / Results  Labs (all labs ordered are listed, but only abnormal results are displayed) Labs Reviewed - No data to display  EKG None  Radiology No results found.  Procedures Procedures (including critical care time)  Medications Ordered in UC Medications  ondansetron (ZOFRAN-ODT) disintegrating tablet 4 mg (has no administration in time range)    Initial Impression / Assessment and Plan / UC Course  I have reviewed the triage vital signs and the nursing notes.  Pertinent labs & imaging results that were available during my care of the patient were reviewed by me and considered in my medical decision making (see chart for details).      Non toxic, afebrile. No emesis. Benign physical exam. Suspect buffet last night may be contributing to symptoms today, likely viral. Supportive cares recommended. Return precautions provided. Patient verbalized understanding and agreeable  to plan.  Ambulatory out of clinic without difficulty.   Final Clinical Impressions(s) / UC Diagnoses   Final diagnoses:  Nausea     Discharge Instructions     Small frequent sips of fluids- Pedialyte, Gatorade, water, broth- to maintain hydration.   Zofran every 8 hours as needed for nausea or vomiting.  If develop worsening of symptoms, dehydration, abdominal pain or fevers please return or go to the ER to be seen.    ED Prescriptions    Medication Sig Dispense Auth. Provider   ondansetron (ZOFRAN) 4 MG tablet Take 1 tablet (4 mg total) by mouth every 8 (eight) hours as needed for nausea or vomiting. 10 tablet Georgetta Haber, NP     Controlled Substance Prescriptions [place] Controlled Substance Registry consulted? Not Applicable   Georgetta Haber, NP 09/03/18 1035

## 2018-09-03 NOTE — Discharge Instructions (Signed)
Small frequent sips of fluids- Pedialyte, Gatorade, water, broth- to maintain hydration.   Zofran every 8 hours as needed for nausea or vomiting.  If develop worsening of symptoms, dehydration, abdominal pain or fevers please return or go to the ER to be seen.

## 2018-09-03 NOTE — ED Triage Notes (Signed)
Pt presents to [facility] fot hot flashes and nausea since this morning.

## 2018-10-09 ENCOUNTER — Ambulatory Visit (INDEPENDENT_AMBULATORY_CARE_PROVIDER_SITE_OTHER): Payer: Medicaid Other | Admitting: Pediatric Endocrinology

## 2018-10-25 ENCOUNTER — Ambulatory Visit (HOSPITAL_COMMUNITY)
Admission: EM | Admit: 2018-10-25 | Discharge: 2018-10-25 | Disposition: A | Discharge status: Home / Self Care | Payer: Medicaid Other | Attending: Family Medicine | Admitting: Family Medicine

## 2018-10-25 ENCOUNTER — Other Ambulatory Visit: Payer: Self-pay

## 2018-10-25 ENCOUNTER — Encounter (HOSPITAL_COMMUNITY): Payer: Self-pay

## 2018-10-25 DIAGNOSIS — J0100 Acute maxillary sinusitis, unspecified: Secondary | ICD-10-CM | POA: Diagnosis not present

## 2018-10-25 MED ORDER — AMOXICILLIN-POT CLAVULANATE 875-125 MG PO TABS: 1.0000 | ORAL_TABLET | Freq: Two times a day (BID) | ORAL | 0 refills | Status: DC

## 2018-10-25 NOTE — ED Provider Notes (Signed)
[facility]   [id number] 10/25/18 Arrival Time: 1809  ASSESSMENT & PLAN:  1. Acute non-recurrent maxillary sinusitis     Meds ordered this encounter  Medications  . amoxicillin-clavulanate (AUGMENTIN) 875-125 MG tablet    Sig: Take 1 tablet by mouth every 12 (twelve) hours.    Dispense:  20 tablet    Refill:  0   Discussed typical duration of symptoms. OTC symptom care as needed. Ensure adequate fluid intake and rest.  Follow-up Information    Diane Erie, MD.   Specialty:  Pediatrics Why:  As needed. Contact information: [address] [place] [address] [phone]          Reviewed expectations re: course of current medical issues. Questions answered. Outlined signs and symptoms indicating need for more acute intervention. Patient verbalized understanding. After Visit Summary given.   SUBJECTIVE: History from: patient. Negative COVID-19 testing on 10/18/2018. Positive rhinovirus.  Diane Mann is a 18 y.o. adult who presents with complaint of nasal congestion, post-nasal drainage, and persistent L maxillary sinus pain. Onset gradual, over the past 1.5 weeks. Respiratory symptoms: sporadic dry cough. Fever: present at onset of viral symptoms; none over the past week. Overall normal PO intake without n/v. OTC treatment: none reported. Seasonal allergies: no. History of frequent sinus infections: no. No specific aggravating or alleviating factors reported. Social History   Tobacco Use  Smoking Status Never Smoker  Smokeless Tobacco Never Used    ROS: As per HPI. All other systems negative.   OBJECTIVE:  Vitals:   10/25/18 1833 10/25/18 1835 10/25/18 1838  BP: 137/78 117/67   Pulse: 96    Resp: 20 18   Temp: 98.2 F (36.8 C) 97.8 F (36.6 C)   TempSrc: Oral Oral   SpO2: 97% 100%   Weight:   88.5 kg     General appearance: alert; no distress HEENT: nasal congestion; clear runny nose; throat irritation secondary to  post-nasal drainage; bilateral maxillary tenderness to palpation; turbinates boggy Neck: supple without LAD; trachea midline Lungs: unlabored respirations, symmetrical air entry; cough: mild and dry; no respiratory distress Skin: warm and dry Psychological: alert and cooperative; normal mood and affect  No Known Allergies  Past Medical History:  Diagnosis Date  . Anxiety   . Asthma   . Asthma 04/24/2015  . 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 Congenital heart problem    blockage  . Headache   . Obesity   . Panic attacks 04/24/2015  . RBBB (right bundle branch block) 04/24/2015  . Social anxiety disorder 04/24/2015   Family History  Problem Relation Age of Onset  . Lupus Mother   . Heart disease Father    Social History   Socioeconomic History  . Marital status: Single    Spouse name: Not on file  . Number of children: Not on file  . Years of education: 79  . Highest education level: 12th grade  Occupational History  . Not on file  Social Needs  . Financial resource strain: Not on file  . Food insecurity:    Worry: Patient refused    Inability: Patient refused  . Transportation needs:    Medical: Patient refused    Non-medical: Patient refused  Tobacco Use  . Smoking status: Never Smoker  . Smokeless tobacco: Never Used  Substance and Sexual Activity  . Alcohol use: No  . Drug use: No  . Sexual activity: Not on file  Lifestyle  . Physical activity:  Days per week: Patient refused    Minutes per session: Patient refused  . Stress: Not on file  Relationships  . Social connections:    Talks on phone: Patient refused    Gets together: Patient refused    Attends religious service: Patient refused    Active member of club or organization: Patient refused    Attends meetings of clubs or organizations: Patient refused    Relationship status: Patient refused  . Intimate partner violence:    Fear of current or ex partner: Patient refused    Emotionally abused:  Patient refused    Physically abused: Patient refused    Forced sexual activity: Patient refused  Other Topics Concern  . Not on file  Social History Narrative   Diane Mann lives with her mom and stepfather, 20 years old brother.  Mom works in housekeeping.  Diane Mann graduated from [employer]. Works at [employer] on [employer]             Diane Mann, Arlys JohnBrian, MD 10/25/18 [phone]

## 2018-10-25 NOTE — ED Triage Notes (Signed)
Pt cc she has sinus  Pressure and drainage. X 1 week or more. Pt has tried Nyquil and Tylenol. Pt states she's has a headache and cough this started today.

## 2019-02-09 ENCOUNTER — Other Ambulatory Visit: Payer: Self-pay | Admitting: *Deleted

## 2019-02-09 DIAGNOSIS — Z20822 Contact with and (suspected) exposure to covid-19: Secondary | ICD-10-CM

## 2019-02-10 LAB — NOVEL CORONAVIRUS, NAA: SARS-CoV-2, NAA: NOT DETECTED

## 2019-03-04 ENCOUNTER — Other Ambulatory Visit: Payer: Self-pay

## 2019-03-04 ENCOUNTER — Encounter (HOSPITAL_COMMUNITY): Payer: Self-pay | Admitting: Emergency Medicine

## 2019-03-04 ENCOUNTER — Emergency Department (HOSPITAL_COMMUNITY)
Admission: EM | Admit: 2019-03-04 | Discharge: 2019-03-05 | Disposition: A | Discharge status: Home / Self Care | Payer: Medicaid Other | Attending: Emergency Medicine | Admitting: Emergency Medicine

## 2019-03-04 DIAGNOSIS — Z5321 Procedure and treatment not carried out due to patient leaving prior to being seen by health care provider: Secondary | ICD-10-CM | POA: Diagnosis not present

## 2019-03-04 DIAGNOSIS — R109 Unspecified abdominal pain: Secondary | ICD-10-CM | POA: Insufficient documentation

## 2019-03-04 LAB — CBC
HCT: 34.6 % — ABNORMAL LOW (ref 36.0–46.0)
Hemoglobin: 11 g/dL — ABNORMAL LOW (ref 12.0–15.0)
MCH: 31.5 pg (ref 26.0–34.0)
MCHC: 31.8 g/dL (ref 30.0–36.0)
MCV: 99.1 fL (ref 80.0–100.0)
Platelets: 276 10*3/uL (ref 150–400)
RBC: 3.49 MIL/uL — ABNORMAL LOW (ref 3.87–5.11)
RDW: 12.8 % (ref 11.5–15.5)
WBC: 9.1 10*3/uL (ref 4.0–10.5)
nRBC: 0 % (ref 0.0–0.2)

## 2019-03-04 LAB — COMPREHENSIVE METABOLIC PANEL
ALT: 13 U/L (ref 0–44)
AST: 23 U/L (ref 15–41)
Albumin: 4.2 g/dL (ref 3.5–5.0)
Alkaline Phosphatase: 33 U/L — ABNORMAL LOW (ref 38–126)
Anion gap: 10 (ref 5–15)
BUN: 11 mg/dL (ref 6–20)
CO2: 25 mmol/L (ref 22–32)
Calcium: 9.4 mg/dL (ref 8.9–10.3)
Chloride: 105 mmol/L (ref 98–111)
Creatinine, Ser: 1.04 mg/dL — ABNORMAL HIGH (ref 0.44–1.00)
GFR calc Af Amer: >60 mL/min (ref >60)
GFR calc non Af Amer: >60 mL/min (ref >60)
Glucose, Bld: 101 mg/dL — ABNORMAL HIGH (ref 70–99)
Potassium: 3.7 mmol/L (ref 3.5–5.1)
Sodium: 140 mmol/L (ref 135–145)
Total Bilirubin: 0.5 mg/dL (ref 0.3–1.2)
Total Protein: 7.4 g/dL (ref 6.5–8.1)

## 2019-03-04 LAB — I-STAT BETA HCG BLOOD, ED (MC, WL, AP ONLY): I-stat hCG, quantitative: <5 m[IU]/mL (ref <5)

## 2019-03-04 LAB — URINALYSIS, ROUTINE W REFLEX MICROSCOPIC
Bilirubin Urine: NEGATIVE
Glucose, UA: NEGATIVE mg/dL
Hgb urine dipstick: NEGATIVE
Ketones, ur: 20 mg/dL — AB
Leukocytes,Ua: NEGATIVE
Nitrite: NEGATIVE
Protein, ur: NEGATIVE mg/dL
Specific Gravity, Urine: 1.021 (ref 1.005–1.030)
pH: 5 (ref 5.0–8.0)

## 2019-03-04 LAB — LIPASE, BLOOD: Lipase: 37 U/L (ref 11–51)

## 2019-03-04 MED ORDER — SODIUM CHLORIDE 0.9% FLUSH: 3.0000 mL | Freq: Once | INTRAVENOUS | Status: DC

## 2019-03-04 NOTE — ED Triage Notes (Signed)
C/o RLQ/pelvic pain since this morning that is worse after urinating.  Denies dysuria.  Reports LMP 3 months ago.

## 2019-03-05 ENCOUNTER — Other Ambulatory Visit: Payer: Self-pay

## 2019-03-05 ENCOUNTER — Encounter (HOSPITAL_COMMUNITY): Payer: Self-pay

## 2019-03-05 ENCOUNTER — Ambulatory Visit (HOSPITAL_COMMUNITY)
Admission: EM | Admit: 2019-03-05 | Discharge: 2019-03-05 | Disposition: A | Discharge status: Home / Self Care | Payer: Medicaid Other | Attending: Family Medicine | Admitting: Family Medicine

## 2019-03-05 ENCOUNTER — Telehealth (HOSPITAL_COMMUNITY): Payer: Self-pay | Admitting: Emergency Medicine

## 2019-03-05 DIAGNOSIS — R300 Dysuria: Secondary | ICD-10-CM | POA: Diagnosis present

## 2019-03-05 DIAGNOSIS — R350 Frequency of micturition: Secondary | ICD-10-CM

## 2019-03-05 DIAGNOSIS — N898 Other specified noninflammatory disorders of vagina: Secondary | ICD-10-CM | POA: Diagnosis present

## 2019-03-05 DIAGNOSIS — N760 Acute vaginitis: Secondary | ICD-10-CM | POA: Insufficient documentation

## 2019-03-05 LAB — POCT URINALYSIS DIP (DEVICE)
Bilirubin Urine: NEGATIVE
Glucose, UA: NEGATIVE mg/dL
Hgb urine dipstick: NEGATIVE
Ketones, ur: NEGATIVE mg/dL
Leukocytes,Ua: NEGATIVE
Nitrite: NEGATIVE
Protein, ur: NEGATIVE mg/dL
Specific Gravity, Urine: >=1.03 (ref 1.005–1.030)
Urobilinogen, UA: 0.2 mg/dL (ref 0.0–1.0)
pH: 7 (ref 5.0–8.0)

## 2019-03-05 MED ORDER — NAPROXEN 500 MG PO TABS: 500.0000 mg | ORAL_TABLET | Freq: Two times a day (BID) | ORAL | 0 refills | Status: DC

## 2019-03-05 MED ORDER — FLUCONAZOLE 150 MG PO TABS: 150.0000 mg | ORAL_TABLET | ORAL | 0 refills | Status: DC

## 2019-03-05 NOTE — ED Notes (Signed)
Per registration pt left

## 2019-03-05 NOTE — ED Provider Notes (Signed)
MRN: [id number] DOB: Dec 12, 2000  Subjective:   Diane Mann is a 18 y.o. adult presenting for 3 day history of urinary frequency, dysuria. Has also had moderate vaginal discharge, vaginal itching. Has had intermittent pelvic pains. Tries to hydrate well with water, no sodas or sweet tea. Denies being sexually active. LMP was ~3 months ago. Denies drug use.   Patient is only taking trazodone, Abilify.    No Known Allergies  Past Medical History:  Diagnosis Date  . Anxiety   . Asthma   . Asthma 04/24/2015  . 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 Congenital heart problem    blockage  . Headache   . Obesity   . Panic attacks 04/24/2015  . RBBB (right bundle branch block) 04/24/2015  . Social anxiety disorder 04/24/2015     Past Surgical History:  Procedure Laterality Date  . MOUTH SURGERY  05/04/2017   Wisdom teeth ectraction Bilateral     Review of Systems  Constitutional: Negative for chills, fever and malaise/fatigue.  Respiratory: Negative for cough and shortness of breath.   Cardiovascular: Negative for chest pain.  Gastrointestinal: Negative for abdominal pain, constipation, diarrhea, nausea and vomiting.  Genitourinary: Positive for dysuria and frequency. Negative for flank pain, hematuria and urgency.  Musculoskeletal: Negative for back pain and myalgias.  Skin: Negative for rash.  Neurological: Negative for dizziness and headaches.  Psychiatric/Behavioral: Negative for substance abuse.    Objective:   Vitals: BP 113/63 (BP Location: Right Arm)   Pulse 76   Temp 97.7 F (36.5 C) (Temporal)   Resp 16   SpO2 98%   Physical Exam Constitutional:      General: He is not in acute distress.    Appearance: Normal appearance. He is well-developed and normal weight. He is not ill-appearing, toxic-appearing or diaphoretic.  HENT:     Head: Normocephalic and atraumatic.     Right Ear: External ear normal.     Left Ear: External ear normal.     Nose: Nose normal.      Mouth/Throat:     Mouth: Mucous membranes are moist.     Pharynx: Oropharynx is clear.  Eyes:     General: No scleral icterus.    Extraocular Movements: Extraocular movements intact.     Pupils: Pupils are equal, round, and reactive to light.  Cardiovascular:     Rate and Rhythm: Normal rate and regular rhythm.     Pulses: Normal pulses.     Heart sounds: Normal heart sounds. No murmur. No friction rub. No gallop.   Pulmonary:     Effort: Pulmonary effort is normal. No respiratory distress.     Breath sounds: Normal breath sounds. No stridor. No wheezing, rhonchi or rales.  Abdominal:     General: Bowel sounds are normal. There is no distension.     Palpations: Abdomen is soft. There is no mass.     Tenderness: There is abdominal tenderness in the right lower quadrant, suprapubic area and left lower quadrant. There is no right CVA tenderness, left CVA tenderness, guarding or rebound.  Skin:    General: Skin is warm and dry.     Coloration: Skin is not pale.     Findings: No rash.  Neurological:     General: No focal deficit present.     Mental Status: He is alert and oriented to person, place, and time.  Psychiatric:        Mood and Affect: Mood normal.  Behavior: Behavior normal.        Thought Content: Thought content normal.        Judgment: Judgment normal.     Results for orders placed or performed during the hospital encounter of 03/04/19 (from the past 24 hour(s))  I-Stat beta hCG blood, ED     Status: None   Collection Time: 03/04/19  8:59 PM  Result Value Ref Range   I-stat hCG, quantitative <5.0 <5 mIU/mL   Comment 3          Lipase, blood     Status: None   Collection Time: 03/04/19  9:00 PM  Result Value Ref Range   Lipase 37 11 - 51 U/L  Comprehensive metabolic panel     Status: Abnormal   Collection Time: 03/04/19  9:00 PM  Result Value Ref Range   Sodium 140 135 - 145 mmol/L   Potassium 3.7 3.5 - 5.1 mmol/L   Chloride 105 98 - 111 mmol/L    CO2 25 22 - 32 mmol/L   Glucose, Bld 101 (H) 70 - 99 mg/dL   BUN 11 6 - 20 mg/dL   Creatinine, Ser 1.611.04 (H) 0.44 - 1.00 mg/dL   Calcium 9.4 8.9 - 09.610.3 mg/dL   Total Protein 7.4 6.5 - 8.1 g/dL   Albumin 4.2 3.5 - 5.0 g/dL   AST 23 15 - 41 U/L   ALT 13 0 - 44 U/L   Alkaline Phosphatase 33 (L) 38 - 126 U/L   Total Bilirubin 0.5 0.3 - 1.2 mg/dL   GFR calc non Af Amer >60 >60 mL/min   GFR calc Af Amer >60 >60 mL/min   Anion gap 10 5 - 15  CBC     Status: Abnormal   Collection Time: 03/04/19  9:00 PM  Result Value Ref Range   WBC 9.1 4.0 - 10.5 K/uL   RBC 3.49 (L) 3.87 - 5.11 MIL/uL   Hemoglobin 11.0 (L) 12.0 - 15.0 g/dL   HCT 04.534.6 (L) 40.936.0 - 81.146.0 %   MCV 99.1 80.0 - 100.0 fL   MCH 31.5 26.0 - 34.0 pg   MCHC 31.8 30.0 - 36.0 g/dL   RDW 91.412.8 78.211.5 - 95.615.5 %   Platelets 276 150 - 400 K/uL   nRBC 0.0 0.0 - 0.2 %  Urinalysis, Routine w reflex microscopic     Status: Abnormal   Collection Time: 03/04/19  9:00 PM  Result Value Ref Range   Color, Urine YELLOW YELLOW   APPearance CLEAR CLEAR   Specific Gravity, Urine 1.021 1.005 - 1.030   pH 5.0 5.0 - 8.0   Glucose, UA NEGATIVE NEGATIVE mg/dL   Hgb urine dipstick NEGATIVE NEGATIVE   Bilirubin Urine NEGATIVE NEGATIVE   Ketones, ur 20 (A) NEGATIVE mg/dL   Protein, ur NEGATIVE NEGATIVE mg/dL   Nitrite NEGATIVE NEGATIVE   Leukocytes,Ua NEGATIVE NEGATIVE     Results for orders placed or performed during the hospital encounter of 03/04/19 (from the past 24 hour(s))  I-Stat beta hCG blood, ED     Status: None   Collection Time: 03/04/19  8:59 PM  Result Value Ref Range   I-stat hCG, quantitative <5.0 <5 mIU/mL   Comment 3          Lipase, blood     Status: None   Collection Time: 03/04/19  9:00 PM  Result Value Ref Range   Lipase 37 11 - 51 U/L  Comprehensive metabolic panel     Status: Abnormal  Collection Time: 03/04/19  9:00 PM  Result Value Ref Range   Sodium 140 135 - 145 mmol/L   Potassium 3.7 3.5 - 5.1 mmol/L   Chloride  105 98 - 111 mmol/L   CO2 25 22 - 32 mmol/L   Glucose, Bld 101 (H) 70 - 99 mg/dL   BUN 11 6 - 20 mg/dL   Creatinine, Ser 1.04 (H) 0.44 - 1.00 mg/dL   Calcium 9.4 8.9 - 10.3 mg/dL   Total Protein 7.4 6.5 - 8.1 g/dL   Albumin 4.2 3.5 - 5.0 g/dL   AST 23 15 - 41 U/L   ALT 13 0 - 44 U/L   Alkaline Phosphatase 33 (L) 38 - 126 U/L   Total Bilirubin 0.5 0.3 - 1.2 mg/dL   GFR calc non Af Amer >60 >60 mL/min   GFR calc Af Amer >60 >60 mL/min   Anion gap 10 5 - 15  CBC     Status: Abnormal   Collection Time: 03/04/19  9:00 PM  Result Value Ref Range   WBC 9.1 4.0 - 10.5 K/uL   RBC 3.49 (L) 3.87 - 5.11 MIL/uL   Hemoglobin 11.0 (L) 12.0 - 15.0 g/dL   HCT 34.6 (L) 36.0 - 46.0 %   MCV 99.1 80.0 - 100.0 fL   MCH 31.5 26.0 - 34.0 pg   MCHC 31.8 30.0 - 36.0 g/dL   RDW 12.8 11.5 - 15.5 %   Platelets 276 150 - 400 K/uL   nRBC 0.0 0.0 - 0.2 %  Urinalysis, Routine w reflex microscopic     Status: Abnormal   Collection Time: 03/04/19  9:00 PM  Result Value Ref Range   Color, Urine YELLOW YELLOW   APPearance CLEAR CLEAR   Specific Gravity, Urine 1.021 1.005 - 1.030   pH 5.0 5.0 - 8.0   Glucose, UA NEGATIVE NEGATIVE mg/dL   Hgb urine dipstick NEGATIVE NEGATIVE   Bilirubin Urine NEGATIVE NEGATIVE   Ketones, ur 20 (A) NEGATIVE mg/dL   Protein, ur NEGATIVE NEGATIVE mg/dL   Nitrite NEGATIVE NEGATIVE   Leukocytes,Ua NEGATIVE NEGATIVE    Assessment and Plan :   1. Acute vaginitis   2. Dysuria   3. Urinary frequency   4. Vaginal discharge   5. Vaginal itching     Will cover for yeast vaginitis given her history.  Start Diflucan.  Labs pending.  Will use naproxen for pain.  Recommended patient set up a follow-up appointment with her gynecologist, counseled that she needs pelvic and transvaginal ultrasound to evaluate for uterine fibroids, ovarian cyst, other possible intrauterine/ovarian sources of her pain.  Beta-hCG from yesterday was negative. Counseled patient on potential for adverse  effects with medications prescribed/recommended today, ER and return-to-clinic precautions discussed, patient verbalized understanding.    Jaynee Eagles, PA-C 03/05/19 1346

## 2019-03-05 NOTE — ED Triage Notes (Signed)
Patient presents to Urgent Care with complaints of painful urination since yesterday. Patient reports her last menstrual was 3 months ago, is on oral contraceptive.

## 2019-03-06 LAB — URINE CULTURE

## 2019-03-07 LAB — CERVICOVAGINAL ANCILLARY ONLY
Bacterial vaginitis: NEGATIVE
Candida vaginitis: POSITIVE — AB
Chlamydia: NEGATIVE
Neisseria Gonorrhea: NEGATIVE
Trichomonas: NEGATIVE

## 2019-03-09 ENCOUNTER — Other Ambulatory Visit: Payer: Self-pay

## 2019-03-09 ENCOUNTER — Ambulatory Visit (HOSPITAL_COMMUNITY)
Admission: EM | Admit: 2019-03-09 | Discharge: 2019-03-09 | Disposition: A | Discharge status: Home / Self Care | Payer: Medicaid Other | Attending: Family Medicine | Admitting: Family Medicine

## 2019-03-09 ENCOUNTER — Encounter (HOSPITAL_COMMUNITY): Payer: Self-pay | Admitting: Emergency Medicine

## 2019-03-09 DIAGNOSIS — R300 Dysuria: Secondary | ICD-10-CM | POA: Diagnosis not present

## 2019-03-09 DIAGNOSIS — R102 Pelvic and perineal pain: Secondary | ICD-10-CM

## 2019-03-09 LAB — POCT URINALYSIS DIP (DEVICE)
Bilirubin Urine: NEGATIVE
Glucose, UA: NEGATIVE mg/dL
Hgb urine dipstick: NEGATIVE
Ketones, ur: NEGATIVE mg/dL
Leukocytes,Ua: NEGATIVE
Nitrite: NEGATIVE
Protein, ur: NEGATIVE mg/dL
Specific Gravity, Urine: >=1.03 (ref 1.005–1.030)
Urobilinogen, UA: 0.2 mg/dL (ref 0.0–1.0)
pH: 7 (ref 5.0–8.0)

## 2019-03-09 NOTE — ED Provider Notes (Signed)
[facility]    CSN: [id number] Arrival date & time: 03/09/19  1327      History   Chief Complaint Chief Complaint  Patient presents with  . Pelvic Pain    HPI Diane Mann is a 18 y.o. adult.   Patient is a 18 year old female past medical history of anxiety, asthma, AV block, congenital heart problem, headache, obesity, social anxiety disorder. She presents today with continued suprapubic pain and associated dysuria.  Symptoms have been waxing and waning.  She was seen here on 03/05/2019 and found to have yeast infection.  She was treated with Diflucan.  Reporting still having vaginal discharge.  Swab was negative for STDs.  She denies being currently sexually active. No LMP recorded. (Menstrual status: Oral contraceptives). Some lower back pain. No fevers, nausea, vomiting.   ROS per HPI       Past Medical History:  Diagnosis Date  . Anxiety   . Asthma   . Asthma 04/24/2015  . 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 Congenital heart problem    blockage  . Headache   . Obesity   . Panic attacks 04/24/2015  . RBBB (right bundle branch block) 04/24/2015  . Social anxiety disorder 04/24/2015    Patient Active Problem List   Diagnosis Date Noted  . Major depressive disorder, recurrent episode, severe (HCC) 08/19/2018  . 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 Social anxiety disorder 04/24/2015  . Panic attacks 04/24/2015  . RBBB (right bundle branch block) 04/24/2015  . 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 Asthma 04/24/2015  . Oligomenorrhea 04/09/2015  . Thyroid function test abnormal 08/05/2014  . 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 Obesity 08/05/2014  . Short stature 08/05/2014    Past Surgical History:  Procedure Laterality Date  . MOUTH SURGERY  05/04/2017   Wisdom teeth ectraction Bilateral     OB History   No obstetric history on file.      Home Medications    Prior  to Admission medications   Medication Sig Start Date End Date Taking? Authorizing Provider  acetaminophen (TYLENOL) 325 MG tablet Take 650 mg by mouth every 6 (six) hours as needed for mild pain or headache.    [provider]  albuterol (PROVENTIL HFA;VENTOLIN HFA) 108 (90 Base) MCG/ACT inhaler Inhale into the lungs every 6 (six) hours as needed for wheezing or shortness of breath.    [provider]  ARIPiprazole (ABILIFY) 5 MG tablet Take 1 tablet (5 mg total) by mouth at bedtime. For mood 08/24/18   Aldean BakerSykes, Janet E, NP  fluconazole (DIFLUCAN) 150 MG tablet Take 1 tablet (150 mg total) by mouth once a week. 03/05/19   Wallis BambergMani, Mario, PA-C  naproxen (NAPROSYN) 500 MG tablet Take 1 tablet (500 mg total) by mouth 2 (two) times daily. 03/05/19   Wallis BambergMani, Mario, PA-C  norethindrone-ethinyl estradiol-iron (MICROGESTIN FE 1.5/30) 1.5-30 MG-MCG tablet Take 1 tablet by mouth daily. Skip placebo pills x 2 months. On the third month- take the placebo (brown) pills. 07/10/18   Dessa PhiBadik, Jennifer, MD  citalopram (CELEXA) 10 MG tablet Take 1 tablet (10 mg total) by mouth daily. For mood 08/25/18 03/05/19  Aldean BakerSykes, Janet E, NP  zolpidem (AMBIEN) 5 MG tablet Take 1 tablet (5 mg total) by mouth at bedtime as needed for sleep. 08/24/18 03/05/19  Aldean BakerSykes, Janet E, NP    Family History Family History  Problem Relation Age of  Onset  . Lupus Mother   . Heart disease Father     Social History Social History   Tobacco Use  . Smoking status: Current Every Day Smoker    Packs/day: 0.20  . Smokeless tobacco: Never Used  . Tobacco comment:  5 perday  Substance Use Topics  . Alcohol use: No  . Drug use: No     Allergies   Patient has no known allergies.   Review of Systems Review of Systems   Physical Exam Triage Vital Signs ED Triage Vitals  Enc Vitals Group     BP 03/09/19 1345 128/64     Pulse Rate 03/09/19 1345 71     Resp 03/09/19 1345 18     Temp 03/09/19 1345 97.8 F (36.6 C)     Temp src --       SpO2 03/09/19 1345 98 %     Weight --      Height --      Head Circumference --      Peak Flow --      Pain Score 03/09/19 1346 10     Pain Loc --      Pain Edu? --      Excl. in [place]? --    No data found.  Updated Vital Signs BP 128/64   Pulse 71   Temp 97.8 F (36.6 C)   Resp 18   SpO2 98%   Visual Acuity Right Eye Distance:   Left Eye Distance:   Bilateral Distance:    Right Eye Near:   Left Eye Near:    Bilateral Near:     Physical Exam Vitals signs and nursing note reviewed.  Constitutional:      Appearance: Normal appearance.  HENT:     Head: Normocephalic and atraumatic.     Nose: Nose normal.  Eyes:     Conjunctiva/sclera: Conjunctivae normal.  Neck:     Musculoskeletal: Normal range of motion.  Pulmonary:     Effort: Pulmonary effort is normal.  Abdominal:     General: Abdomen is flat.     Palpations: Abdomen is soft. There is no shifting dullness, fluid wave, hepatomegaly, splenomegaly, mass or pulsatile mass.     Tenderness: There is abdominal tenderness in the suprapubic area. There is no right CVA tenderness, left CVA tenderness, guarding or rebound. Negative signs include Murphy's sign, Rovsing's sign, McBurney's sign, psoas sign and obturator sign.  Musculoskeletal: Normal range of motion.  Skin:    General: Skin is warm and dry.  Neurological:     Mental Status: He is alert.  Psychiatric:        Mood and Affect: Mood normal.      UC Treatments / Results  Labs (all labs ordered are listed, but only abnormal results are displayed) Labs Reviewed  POCT URINALYSIS DIP (DEVICE)    EKG   Radiology No results found.  Procedures Procedures (including critical care time)  Medications Ordered in UC Medications - No data to display  Initial Impression / Assessment and Plan / UC Course  I have reviewed the triage vital signs and the nursing notes.  Pertinent labs & imaging results that were available during my care of the patient  were reviewed by me and considered in my medical decision making (see chart for details).     Urine negative for infection Pt was already worked up here for STDs and examined a few days ago.  She was negative for STDs and positive for  yeast.  She was treated for this. She was recommended to follow-up with OB/GYN for further evaluation with ultrasound Will have her follow-up with OB/GYN. Recommended for severe pain, fever, nausea, vomiting she will need to go the hospital.  Final Clinical Impressions(s) / UC Diagnoses   Final diagnoses:  Dysuria  Suprapubic pain     Discharge Instructions     Your urine did not show any infection.  STD screening from the other day did not show any infection.  Already treated for yeast infection You need an ultrasound that we cannot perform here  You need to follow up with an OB/GYN for this.  If the pain becomes severe you can go to the ER for an ultrasound.  Naproxen for pain.      ED Prescriptions    None     Controlled Substance Prescriptions [place] Controlled Substance Registry consulted? Not Applicable   Janace Aris, NP 03/09/19 1511

## 2019-03-09 NOTE — Discharge Instructions (Addendum)
Your urine did not show any infection.  STD screening from the other day did not show any infection.  Already treated for yeast infection You need an ultrasound that we cannot perform here  You need to follow up with an OB/GYN for this.  If the pain becomes severe you can go to the ER for an ultrasound.  Naproxen for pain.

## 2019-03-20 ENCOUNTER — Ambulatory Visit (HOSPITAL_COMMUNITY)
Admission: EM | Admit: 2019-03-20 | Discharge: 2019-03-20 | Disposition: A | Discharge status: Home / Self Care | Payer: Medicaid Other | Attending: Family Medicine | Admitting: Family Medicine

## 2019-03-20 ENCOUNTER — Encounter (HOSPITAL_COMMUNITY): Payer: Self-pay

## 2019-03-20 ENCOUNTER — Other Ambulatory Visit: Payer: Self-pay

## 2019-03-20 DIAGNOSIS — K5900 Constipation, unspecified: Secondary | ICD-10-CM

## 2019-03-20 DIAGNOSIS — R112 Nausea with vomiting, unspecified: Secondary | ICD-10-CM

## 2019-03-20 DIAGNOSIS — E669 Obesity, unspecified: Secondary | ICD-10-CM

## 2019-03-20 DIAGNOSIS — Z3202 Encounter for pregnancy test, result negative: Secondary | ICD-10-CM

## 2019-03-20 DIAGNOSIS — R1084 Generalized abdominal pain: Secondary | ICD-10-CM | POA: Diagnosis not present

## 2019-03-20 LAB — POCT URINALYSIS DIP (DEVICE)
Bilirubin Urine: NEGATIVE
Glucose, UA: NEGATIVE mg/dL
Ketones, ur: NEGATIVE mg/dL
Leukocytes,Ua: NEGATIVE
Nitrite: NEGATIVE
Protein, ur: NEGATIVE mg/dL
Specific Gravity, Urine: 1.02 (ref 1.005–1.030)
Urobilinogen, UA: 0.2 mg/dL (ref 0.0–1.0)
pH: 6 (ref 5.0–8.0)

## 2019-03-20 LAB — POCT PREGNANCY, URINE: Preg Test, Ur: NEGATIVE

## 2019-03-20 MED ORDER — DOCUSATE SODIUM 50 MG PO CAPS: 50.0000 mg | ORAL_CAPSULE | Freq: Two times a day (BID) | ORAL | 0 refills | Status: DC

## 2019-03-20 MED ORDER — ONDANSETRON 8 MG PO TBDP: 8.0000 mg | ORAL_TABLET | Freq: Three times a day (TID) | ORAL | 0 refills | Status: DC | PRN

## 2019-03-20 NOTE — ED Provider Notes (Signed)
MRN: [id number] DOB: 03/06/2001  Subjective:   Diane Mann is a 18 y.o. adult presenting for 1 week history of persistent moderate sharp generalized abdominal pain. Has had associated vomiting, 2-3 episodes per day. Has had mild intermittent headaches. Denies fever, diarrhea, throat pain, body aches, coughing, throat pain, dysuria, urinary frequency, pelvic pain.  Has a history of external hemorrhoids.  Admits practicing a poor diet, eats very starchy foods like pizza, lasagna and heavy carbohydrates.  She states that she has difficulty with painful defecation, has to strain to poop.  No current facility-administered medications for this encounter.   Current Outpatient Medications:  .  acetaminophen (TYLENOL) 325 MG tablet, Take 650 mg by mouth every 6 (six) hours as needed for mild pain or headache., Disp: , Rfl:  .  albuterol (PROVENTIL HFA;VENTOLIN HFA) 108 (90 Base) MCG/ACT inhaler, Inhale into the lungs every 6 (six) hours as needed for wheezing or shortness of breath., Disp: , Rfl:  .  ARIPiprazole (ABILIFY) 5 MG tablet, Take 1 tablet (5 mg total) by mouth at bedtime. For mood, Disp: 30 tablet, Rfl: 0 .  fluconazole (DIFLUCAN) 150 MG tablet, Take 1 tablet (150 mg total) by mouth once a week., Disp: 2 tablet, Rfl: 0 .  naproxen (NAPROSYN) 500 MG tablet, Take 1 tablet (500 mg total) by mouth 2 (two) times daily., Disp: 30 tablet, Rfl: 0 .  norethindrone-ethinyl estradiol-iron (MICROGESTIN FE 1.5/30) 1.5-30 MG-MCG tablet, Take 1 tablet by mouth daily. Skip placebo pills x 2 months. On the third month- take the placebo (brown) pills., Disp: 84 tablet, Rfl: 11    No Known Allergies   Past Medical History:  Diagnosis Date  . Anxiety   . Asthma   . Asthma 04/24/2015  . 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 Congenital heart problem    blockage  . Headache   . Obesity   . Panic attacks 04/24/2015  . RBBB (right bundle branch block) 04/24/2015  . Social anxiety disorder 04/24/2015      Past Surgical History:  Procedure Laterality Date  . MOUTH SURGERY  05/04/2017   Wisdom teeth ectraction Bilateral     ROS  Objective:   Vitals: BP 135/66 (BP Location: Left Arm)   Pulse 96   Temp 98.8 F (37.1 C) (Oral)   Resp 18   SpO2 99%   Physical Exam Constitutional:      General: He is not in acute distress.    Appearance: Normal appearance. He is well-developed. He is obese. He is not ill-appearing, toxic-appearing or diaphoretic.  HENT:     Head: Normocephalic and atraumatic.     Nose: Nose normal.     Mouth/Throat:     Mouth: Mucous membranes are moist.  Eyes:     General: No scleral icterus.    Extraocular Movements: Extraocular movements intact.     Pupils: Pupils are equal, round, and reactive to light.  Cardiovascular:     Rate and Rhythm: Normal rate and regular rhythm.     Pulses: Normal pulses.     Heart sounds: Normal heart sounds. No murmur. No friction rub. No gallop.   Pulmonary:     Effort: Pulmonary effort is normal. No respiratory distress.     Breath sounds: Normal breath sounds. No stridor. No wheezing, rhonchi or rales.  Abdominal:     General: Bowel sounds are normal. There is no distension.     Palpations: Abdomen is soft. There is no mass.     Tenderness:  There is abdominal tenderness (Generalized throughout). There is no guarding or rebound.  Skin:    General: Skin is warm and dry.     Findings: No rash.  Neurological:     Mental Status: He is alert and oriented to person, place, and time.  Psychiatric:        Mood and Affect: Mood normal.        Behavior: Behavior normal.        Thought Content: Thought content normal.      Results for orders placed or performed during the hospital encounter of 03/20/19 (from the past 24 hour(s))  POCT urinalysis dip (device)     Status: Abnormal   Collection Time: 03/20/19  5:22 PM  Result Value Ref Range   Glucose, UA NEGATIVE NEGATIVE mg/dL   Bilirubin Urine NEGATIVE NEGATIVE    Ketones, ur NEGATIVE NEGATIVE mg/dL   Specific Gravity, Urine 1.020 1.005 - 1.030   Hgb urine dipstick TRACE (A) NEGATIVE   pH 6.0 5.0 - 8.0   Protein, ur NEGATIVE NEGATIVE mg/dL   Urobilinogen, UA 0.2 0.0 - 1.0 mg/dL   Nitrite NEGATIVE NEGATIVE   Leukocytes,Ua NEGATIVE NEGATIVE  Pregnancy, urine POC     Status: None   Collection Time: 03/20/19  5:29 PM  Result Value Ref Range   Preg Test, Ur NEGATIVE NEGATIVE    Assessment and Plan :   1. Generalized abdominal pain   2. Nausea and vomiting, intractability of vomiting not specified, unspecified vomiting type   3. Obesity, unspecified classification, unspecified obesity type, unspecified whether serious comorbidity present   4. Constipation, unspecified constipation type     Counseled patient on need for significant dietary modifications.  We will have her use Zofran and stool softener in the meantime.  Point-of-care labs reassuring.  Patient is to establish care with PCP at [facility] internal medicine or clinic otherwise designated by them. Counseled patient on potential for adverse effects with medications prescribed/recommended today, ER and return-to-clinic precautions discussed, patient verbalized understanding.    Wallis Bamberg, [place] 03/20/19 1744

## 2019-03-20 NOTE — ED Triage Notes (Signed)
Pt presents with generalized abdominal pain and vomiting X 7 days.

## 2019-03-20 NOTE — Discharge Instructions (Addendum)

## 2019-03-28 ENCOUNTER — Encounter (HOSPITAL_COMMUNITY): Payer: Self-pay

## 2019-03-28 ENCOUNTER — Other Ambulatory Visit: Payer: Self-pay

## 2019-03-28 DIAGNOSIS — F17210 Nicotine dependence, cigarettes, uncomplicated: Secondary | ICD-10-CM | POA: Insufficient documentation

## 2019-03-28 DIAGNOSIS — Z79899 Other long term (current) drug therapy: Secondary | ICD-10-CM | POA: Diagnosis not present

## 2019-03-28 DIAGNOSIS — K5900 Constipation, unspecified: Secondary | ICD-10-CM | POA: Diagnosis not present

## 2019-03-28 DIAGNOSIS — J45909 Unspecified asthma, uncomplicated: Secondary | ICD-10-CM | POA: Diagnosis not present

## 2019-03-28 DIAGNOSIS — K625 Hemorrhage of anus and rectum: Secondary | ICD-10-CM | POA: Diagnosis not present

## 2019-03-28 DIAGNOSIS — R1084 Generalized abdominal pain: Secondary | ICD-10-CM | POA: Diagnosis present

## 2019-03-28 LAB — I-STAT BETA HCG BLOOD, ED (MC, WL, AP ONLY): I-stat hCG, quantitative: <5 m[IU]/mL (ref <5)

## 2019-03-28 LAB — CBC
HCT: 33.8 % — ABNORMAL LOW (ref 36.0–46.0)
Hemoglobin: 10.8 g/dL — ABNORMAL LOW (ref 12.0–15.0)
MCH: 32 pg (ref 26.0–34.0)
MCHC: 32 g/dL (ref 30.0–36.0)
MCV: 100.3 fL — ABNORMAL HIGH (ref 80.0–100.0)
Platelets: 265 K/uL (ref 150–400)
RBC: 3.37 MIL/uL — ABNORMAL LOW (ref 3.87–5.11)
RDW: 12.8 % (ref 11.5–15.5)
WBC: 7.6 K/uL (ref 4.0–10.5)
nRBC: 0 % (ref 0.0–0.2)

## 2019-03-28 MED ORDER — SODIUM CHLORIDE 0.9% FLUSH: 3.0000 mL | Freq: Once | INTRAVENOUS | Status: DC

## 2019-03-28 NOTE — ED Triage Notes (Signed)
Pt arrived with complaints of abdominal pain, vomiting, and rectal bleeding that started about a month ago. Reports bright red blood when having a bowel movement.

## 2019-03-29 ENCOUNTER — Emergency Department (HOSPITAL_COMMUNITY): Payer: Medicaid Other

## 2019-03-29 ENCOUNTER — Emergency Department (HOSPITAL_COMMUNITY)
Admission: EM | Admit: 2019-03-29 | Discharge: 2019-03-29 | Disposition: A | Discharge status: Home / Self Care | Payer: Medicaid Other | Attending: Emergency Medicine | Admitting: Emergency Medicine

## 2019-03-29 DIAGNOSIS — K625 Hemorrhage of anus and rectum: Secondary | ICD-10-CM

## 2019-03-29 DIAGNOSIS — K5900 Constipation, unspecified: Secondary | ICD-10-CM

## 2019-03-29 LAB — COMPREHENSIVE METABOLIC PANEL
ALT: 15 U/L (ref 0–44)
AST: 26 U/L (ref 15–41)
Albumin: 4.9 g/dL (ref 3.5–5.0)
Alkaline Phosphatase: 38 U/L (ref 38–126)
Anion gap: 10 (ref 5–15)
BUN: 15 mg/dL (ref 6–20)
CO2: 26 mmol/L (ref 22–32)
Calcium: 9.4 mg/dL (ref 8.9–10.3)
Chloride: 103 mmol/L (ref 98–111)
Creatinine, Ser: 1.02 mg/dL — ABNORMAL HIGH (ref 0.44–1.00)
GFR calc Af Amer: >60 mL/min (ref >60)
GFR calc non Af Amer: >60 mL/min (ref >60)
Glucose, Bld: 86 mg/dL (ref 70–99)
Potassium: 3.7 mmol/L (ref 3.5–5.1)
Sodium: 139 mmol/L (ref 135–145)
Total Bilirubin: 0.6 mg/dL (ref 0.3–1.2)
Total Protein: 8.3 g/dL — ABNORMAL HIGH (ref 6.5–8.1)

## 2019-03-29 LAB — URINALYSIS, ROUTINE W REFLEX MICROSCOPIC
Bilirubin Urine: NEGATIVE
Glucose, UA: NEGATIVE mg/dL
Hgb urine dipstick: NEGATIVE
Ketones, ur: NEGATIVE mg/dL
Leukocytes,Ua: NEGATIVE
Nitrite: NEGATIVE
Protein, ur: NEGATIVE mg/dL
Specific Gravity, Urine: 1.018 (ref 1.005–1.030)
pH: 5 (ref 5.0–8.0)

## 2019-03-29 LAB — LIPASE, BLOOD: Lipase: 33 U/L (ref 11–51)

## 2019-03-29 NOTE — Discharge Instructions (Addendum)
Your labs and exam today are reassuring and suggest constipation.   Recommend:  A high fiber diet (in instructions) Miralax - 17 gram dose up to 3 times a day for 3 consecutive days or fewer days if you clear your bowel.  Dulcolax - stool softener to help prevent bleeding when you pass hard stool  Please follow up with [facility] or with a primary care provider of your choice if you have ongoing problems.   Return to the emergency department if you have severe pain, high fever, continuous bleeding or new concern.

## 2019-03-31 NOTE — ED Provider Notes (Signed)
[facility] DEPT Provider Note   CSN: [id number] Arrival date & time: 03/28/19  2150     History   Chief Complaint Chief Complaint  Patient presents with  . Abdominal Pain  . Rectal Bleeding    HPI ADREAM PARZYCH is a 18 y.o. female.     Patient to ED with complaint of generalized abdominal pain and rectal bleeding for the past one month. She has infrequent vomiting as well. No fever, hematemesis, chest pain, urinary symptoms. She was seen at Urgent Care on 03/20/19 for similar symptoms but is here for persistent symptoms. She reports the rectal bleeding is BRB with normal color of stools. She has to strain to pass a BM and states her stools are usually very hard.   The history is provided by the patient. No language interpreter was used.  Abdominal Pain Associated symptoms: hematochezia, nausea and vomiting   Associated symptoms: no chills and no fever   Rectal Bleeding Associated symptoms: abdominal pain and vomiting   Associated symptoms: no fever     Past Medical History:  Diagnosis Date  . Anxiety   . Asthma   . Asthma 04/24/2015  . 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 Congenital heart problem    blockage  . Headache   . Obesity   . Panic attacks 04/24/2015  . RBBB (right bundle branch block) 04/24/2015  . Social anxiety disorder 04/24/2015    Patient Active Problem List   Diagnosis Date Noted  . Major depressive disorder, recurrent episode, severe ([place]) 08/19/2018  . 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 Social anxiety disorder 04/24/2015  . Panic attacks 04/24/2015  . RBBB (right bundle branch block) 04/24/2015  . 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 Asthma 04/24/2015  . Oligomenorrhea 04/09/2015  . Thyroid function test abnormal 08/05/2014  . 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 Obesity 08/05/2014  . Short stature 08/05/2014    Past Surgical History:   Procedure Laterality Date  . MOUTH SURGERY  05/04/2017   Wisdom teeth ectraction Bilateral      OB History   No obstetric history on file.      Home Medications    Prior to Admission medications   Medication Sig Start Date End Date Taking? Authorizing Provider  acetaminophen (TYLENOL) 325 MG tablet Take 650 mg by mouth every 6 (six) hours as needed for mild pain or headache.   Yes [provider]  albuterol (PROVENTIL HFA;VENTOLIN HFA) 108 (90 Base) MCG/ACT inhaler Inhale into the lungs every 6 (six) hours as needed for wheezing or shortness of breath.   Yes [provider]  ARIPiprazole (ABILIFY) 5 MG tablet Take 1 tablet (5 mg total) by mouth at bedtime. For mood 08/24/18  Yes Connye Burkitt, NP  norethindrone-ethinyl estradiol-iron (MICROGESTIN FE 1.5/30) 1.5-30 MG-MCG tablet Take 1 tablet by mouth daily. Skip placebo pills x 2 months. On the third month- take the placebo (brown) pills. 07/10/18  Yes Lelon Huh, MD  traZODone (DESYREL) 50 MG tablet Take 50-100 mg by mouth at bedtime as needed for sleep.   Yes [provider]  docusate sodium (COLACE) 50 MG capsule Take 1 capsule (50 mg total) by mouth 2 (two) times daily. 03/20/19   Jaynee Eagles, PA-C  naproxen (NAPROSYN) 500 MG tablet Take 1 tablet (500 mg total) by mouth 2 (two) times daily. Patient not taking: Reported on 03/29/2019 03/05/19   Bess Harvest,  Marquita Palms, PA-C  ondansetron (ZOFRAN-ODT) 8 MG disintegrating tablet Take 1 tablet (8 mg total) by mouth every 8 (eight) hours as needed for nausea or vomiting. 03/20/19   Wallis Bamberg, PA-C  citalopram (CELEXA) 10 MG tablet Take 1 tablet (10 mg total) by mouth daily. For mood 08/25/18 03/05/19  Aldean Baker, NP  zolpidem (AMBIEN) 5 MG tablet Take 1 tablet (5 mg total) by mouth at bedtime as needed for sleep. 08/24/18 03/05/19  Aldean Baker, NP    Family History Family History  Problem Relation Age of Onset  . Lupus Mother   . Heart disease Father     Social  History Social History   Tobacco Use  . Smoking status: Current Every Day Smoker    Packs/day: 0.20  . Smokeless tobacco: Never Used  . Tobacco comment:  5 perday  Substance Use Topics  . Alcohol use: No  . Drug use: No     Allergies   Patient has no known allergies.   Review of Systems Review of Systems  Constitutional: Negative for chills and fever.  Respiratory: Negative.   Cardiovascular: Negative.   Gastrointestinal: Positive for abdominal pain, blood in stool, hematochezia, nausea and vomiting.  Genitourinary: Negative.   Musculoskeletal: Negative.  Negative for myalgias.  Skin: Negative.   Neurological: Negative.      Physical Exam Updated Vital Signs BP 107/62 (BP Location: Left Arm)   Pulse 63   Temp 98.3 F (36.8 C) (Oral)   Resp 13   Ht 5\' 1"  (1.549 m)   Wt 88.5 kg   SpO2 96%   BMI 36.84 kg/m   Physical Exam Vitals signs and nursing note reviewed.  Constitutional:      Appearance: She is well-developed.  Neck:     Musculoskeletal: Normal range of motion.  Pulmonary:     Effort: Pulmonary effort is normal.  Abdominal:     Tenderness: There is generalized abdominal tenderness.     Comments: Obese abdomen, soft.  Genitourinary:    Rectum: Guaiac result negative. Tenderness present.     Comments: No visualized external hemorrhoids.  Skin:    General: Skin is warm and dry.  Neurological:     Mental Status: She is alert and oriented to person, place, and time.      ED Treatments / Results  Labs (all labs ordered are listed, but only abnormal results are displayed) Labs Reviewed  COMPREHENSIVE METABOLIC PANEL - Abnormal; Notable for the following components:      Result Value   Creatinine, Ser 1.02 (*)    Total Protein 8.3 (*)    All other components within normal limits  CBC - Abnormal; Notable for the following components:   RBC 3.37 (*)    Hemoglobin 10.8 (*)    HCT 33.8 (*)    MCV 100.3 (*)    All other components within normal  limits  LIPASE, BLOOD  URINALYSIS, ROUTINE W REFLEX MICROSCOPIC  I-STAT BETA HCG BLOOD, ED ([facility], [facility], [facility] ONLY)    EKG None  Radiology No results found.  Procedures Procedures (including critical care time)  Medications Ordered in ED Medications - No data to display   Initial Impression / Assessment and Plan / ED Course  I have reviewed the triage vital signs and the nursing notes.  Pertinent labs & imaging results that were available during my care of the patient were reviewed by me and considered in my medical decision making (see chart for details).  Patient to ED with ongoing generalized abdominal pain, some vomiting, bleeding with hard stools x 1 month.   She is well appearing. Abdominal exam is generally tender, soft, nonfocal. Rectal exam unremarkable. Guaiac negative stool card.  Feel the patient is likely constipated resulting in current symptoms. She can be discharged home with referral for primary care. Recommended stool softeners, Miralax.    Final Clinical Impressions(s) / ED Diagnoses   Final diagnoses:  Constipation, unspecified constipation type  Rectal bleeding    ED Discharge Orders    None       Elpidio AnisUpstill, Jelani Vreeland, PA-C 03/31/19 [id number]    Nira Connardama, Pedro Eduardo, MD 04/04/19 [phone]

## 2019-04-05 ENCOUNTER — Emergency Department (HOSPITAL_COMMUNITY)
Admission: EM | Admit: 2019-04-05 | Discharge: 2019-04-05 | Disposition: A | Discharge status: Home / Self Care | Payer: Medicaid Other | Attending: Emergency Medicine | Admitting: Emergency Medicine

## 2019-04-05 ENCOUNTER — Encounter (HOSPITAL_COMMUNITY): Payer: Self-pay | Admitting: Emergency Medicine

## 2019-04-05 DIAGNOSIS — M545 Low back pain: Secondary | ICD-10-CM | POA: Diagnosis present

## 2019-04-05 DIAGNOSIS — J45909 Unspecified asthma, uncomplicated: Secondary | ICD-10-CM | POA: Diagnosis not present

## 2019-04-05 DIAGNOSIS — Y999 Unspecified external cause status: Secondary | ICD-10-CM | POA: Diagnosis not present

## 2019-04-05 DIAGNOSIS — Y9389 Activity, other specified: Secondary | ICD-10-CM | POA: Diagnosis not present

## 2019-04-05 DIAGNOSIS — Y92410 Unspecified street and highway as the place of occurrence of the external cause: Secondary | ICD-10-CM | POA: Diagnosis not present

## 2019-04-05 DIAGNOSIS — F17200 Nicotine dependence, unspecified, uncomplicated: Secondary | ICD-10-CM | POA: Diagnosis not present
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
## 2019-04-05 NOTE — ED Notes (Signed)
PT sitting with mother in [place]
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
## 2019-04-05 NOTE — Discharge Instructions (Addendum)
You were evaluated in the Emergency Department and after careful evaluation, we did not find any emergent condition requiring admission or further testing in the hospital.  Your exam/testing today is overall reassuring.  We recommend Tylenol or ibuprofen at home for discomfort.  Please return to the Emergency Department if you experience any worsening of your condition.  We encourage you to follow up with a primary care provider.  Thank you for allowing [place] to be a part of your care.

## 2019-04-05 NOTE — ED Notes (Signed)
MD at bedside. 

## 2019-04-12 NOTE — Progress Notes (Deleted)
  This is a Pediatric Specialist E-Visit follow up consult provided via  [place] and their parent/guardian Alyshia Kernan consented to an E-Visit consult today.  Location of patient: [name] is at *** (location) Location of provider: Lelon Huh MD Office Location Patient was referred by Lurlean Leyden, MD   The following participants were involved in this E-Visit: Lelon Huh MD- Karl Pock, Patient  Chief Complain/ Reason for E-Visit today: *** Total time on call: *** Follow up: ***

## 2019-04-13 ENCOUNTER — Ambulatory Visit (INDEPENDENT_AMBULATORY_CARE_PROVIDER_SITE_OTHER): Payer: Medicaid Other | Admitting: Pediatric Endocrinology

## 2019-05-14 ENCOUNTER — Encounter (HOSPITAL_COMMUNITY): Payer: Self-pay

## 2019-05-14 ENCOUNTER — Ambulatory Visit (HOSPITAL_COMMUNITY)
Admission: EM | Admit: 2019-05-14 | Discharge: 2019-05-14 | Disposition: A | Discharge status: Home / Self Care | Payer: Medicaid Other | Attending: Internal Medicine | Admitting: Internal Medicine

## 2019-05-14 ENCOUNTER — Other Ambulatory Visit: Payer: Self-pay

## 2019-05-14 DIAGNOSIS — Z20828 Contact with and (suspected) exposure to other viral communicable diseases: Secondary | ICD-10-CM | POA: Diagnosis present

## 2019-05-14 DIAGNOSIS — Z20822 Contact with and (suspected) exposure to covid-19: Secondary | ICD-10-CM

## 2019-05-14 NOTE — ED Provider Notes (Signed)
[place]    CSN: [id number] Arrival date & time: 05/14/19  1015      History   Chief Complaint Chief Complaint  Patient presents with  . covid exposure    HPI Diane Mann is a 18 y.o. female.   [age]y old female who presents for COVID testing.  Was exposed  to [place] by her step father who tested positive on 05/12/19.  Denies recent travel.  Denies aggravating or alleviating symptoms.  Denies previous COVID infection.   Denies fever, chills, fatigue, nasal congestion, rhinorrhea, sore throat, cough, SOB, wheezing, chest pain, nausea, vomiting, changes in bowel or bladder habits  The history is provided by the patient. No language interpreter was used.    Past Medical History:  Diagnosis Date  . Anxiety   . Asthma   . Asthma 04/24/2015  . 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 Congenital heart problem    blockage  . Headache   . Obesity   . Panic attacks 04/24/2015  . RBBB (right bundle branch block) 04/24/2015  . Social anxiety disorder 04/24/2015    Patient Active Problem List   Diagnosis Date Noted  . Major depressive disorder, recurrent episode, severe ([place]) 08/19/2018  . 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 Social anxiety disorder 04/24/2015  . Panic attacks 04/24/2015  . RBBB (right bundle branch block) 04/24/2015  . 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 Asthma 04/24/2015  . Oligomenorrhea 04/09/2015  . Thyroid function test abnormal 08/05/2014  . 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 Obesity 08/05/2014  . Short stature 08/05/2014    Past Surgical History:  Procedure Laterality Date  . MOUTH SURGERY  05/04/2017   Wisdom teeth ectraction Bilateral     OB History   No obstetric history on file.      Home Medications    Prior to Admission medications   Medication Sig Start Date End Date Taking? Authorizing Provider  acetaminophen (TYLENOL) 325 MG tablet Take 650 mg  by mouth every 6 (six) hours as needed for mild pain or headache.    [provider]  albuterol (PROVENTIL HFA;VENTOLIN HFA) 108 (90 Base) MCG/ACT inhaler Inhale into the lungs every 6 (six) hours as needed for wheezing or shortness of breath.    [provider]  ARIPiprazole (ABILIFY) 5 MG tablet Take 1 tablet (5 mg total) by mouth at bedtime. For mood 08/24/18   Connye Burkitt, NP  docusate sodium (COLACE) 50 MG capsule Take 1 capsule (50 mg total) by mouth 2 (two) times daily. 03/20/19   Jaynee Eagles, PA-C  naproxen (NAPROSYN) 500 MG tablet Take 1 tablet (500 mg total) by mouth 2 (two) times daily. Patient not taking: Reported on 03/29/2019 03/05/19   Jaynee Eagles, PA-C  norethindrone-ethinyl estradiol-iron (MICROGESTIN FE 1.5/30) 1.5-30 MG-MCG tablet Take 1 tablet by mouth daily. Skip placebo pills x 2 months. On the third month- take the placebo (brown) pills. 07/10/18   Lelon Huh, MD  ondansetron (ZOFRAN-ODT) 8 MG disintegrating tablet Take 1 tablet (8 mg total) by mouth every 8 (eight) hours as needed for nausea or vomiting. 03/20/19   Jaynee Eagles, PA-C  traZODone (DESYREL) 50 MG tablet Take 50-100 mg by mouth at bedtime as needed for sleep.    [provider]  citalopram (CELEXA) 10 MG tablet Take 1 tablet (10 mg total) by mouth daily. For mood 08/25/18 03/05/19  Connye Burkitt, NP  zolpidem (AMBIEN) 5 MG tablet Take 1 tablet (5 mg total) by mouth at bedtime as needed for sleep. 08/24/18 03/05/19  Aldean BakerSykes, Janet E, NP    Family History Family History  Problem Relation Age of Onset  . Lupus Mother   . Heart disease Father     Social History Social History   Tobacco Use  . Smoking status: Current Every Day Smoker    Packs/day: 0.20  . Smokeless tobacco: Never Used  . Tobacco comment:  5 perday  Substance Use Topics  . Alcohol use: No  . Drug use: No     Allergies   Patient has no known allergies.   Review of Systems Review of Systems  Constitutional:  Negative for activity change, appetite change, chills, fatigue and fever.  HENT: Negative for congestion, ear pain, sneezing and sore throat.   Respiratory: Negative for cough, chest tightness, shortness of breath and wheezing.   Cardiovascular: Negative for chest pain, palpitations and leg swelling.  Neurological: Negative for headaches.  ROS: All other are negatives  Physical Exam Triage Vital Signs ED Triage Vitals  Enc Vitals Group     BP 05/14/19 1105 119/66     Pulse Rate 05/14/19 1105 73     Resp 05/14/19 1105 16     Temp 05/14/19 1105 98.5 F (36.9 C)     Temp Source 05/14/19 1105 Oral     SpO2 05/14/19 1105 98 %     Weight --      Height --      Head Circumference --      Peak Flow --      Pain Score 05/14/19 1103 0     Pain Loc --      Pain Edu? --      Excl. in GC? --    No data found.  Updated Vital Signs BP 119/66 (BP Location: Right Arm)   Pulse 73   Temp 98.5 F (36.9 C) (Oral)   Resp 16   SpO2 98%   Visual Acuity Right Eye Distance:   Left Eye Distance:   Bilateral Distance:    Right Eye Near:   Left Eye Near:    Bilateral Near:     Physical Exam Vitals signs and nursing note reviewed.  Constitutional:      General: She is not in acute distress.    Appearance: Normal appearance. She is obese. She is not ill-appearing or toxic-appearing.  HENT:     Head: Normocephalic.     Right Ear: Tympanic membrane normal.     Left Ear: Tympanic membrane normal.     Nose: Nose normal.     Mouth/Throat:     Mouth: Mucous membranes are moist.  Cardiovascular:     Rate and Rhythm: Normal rate and regular rhythm.     Pulses: Normal pulses.     Heart sounds: Normal heart sounds. No murmur. No gallop.   Pulmonary:     Effort: Pulmonary effort is normal. No respiratory distress.     Breath sounds: Normal breath sounds. No wheezing or rhonchi.  Chest:     Chest wall: No tenderness.  Neurological:     Mental Status: She is alert and oriented to person,  place, and time.      UC Treatments / Results  Labs (all labs ordered are listed, but only abnormal results are displayed) Labs Reviewed  NOVEL CORONAVIRUS, NAA (HOSP ORDER, SEND-OUT TO REF LAB; TAT 18-24 HRS)    EKG  Radiology No results found.  Procedures Procedures (including critical care time)  Medications Ordered in UC Medications - No data to display  Initial Impression / Assessment and Plan / UC Course  I have reviewed the triage vital signs and the nursing notes.  Pertinent labs & imaging results that were available during my care of the patient were reviewed by me and considered in my medical decision making (see chart for details).   Patient with COVID-19 exposure. COVID test was completed at today visit  Final Clinical Impressions(s) / UC Diagnoses   Final diagnoses:  Exposure to COVID-19 virus     Discharge Instructions     COVID testing ordered.  It will take between 5-7 days for test results.  Someone will contact you regarding abnormal results.    In the meantime: You should remain isolated in your home for 10 days from symptom onset AND greater than 72 hours after symptoms resolution (absence of fever without the use of fever-reducing medication and improvement in respiratory symptoms), whichever is longer Get plenty of rest and push fluids Use medications daily for symptom relief Use OTC medications like ibuprofen or tylenol as needed fever or pain Call or go to the ED if you have any new or worsening symptoms such as fever, worsening cough, shortness of breath, chest tightness, chest pain, turning blue, changes in mental status, etc...    ED Prescriptions    None     PDMP not reviewed this encounter.   Durward Parcel, FNP 05/14/19 1147

## 2019-05-14 NOTE — Discharge Instructions (Addendum)

## 2019-05-14 NOTE — ED Triage Notes (Signed)
Pt present to the UC for Covid test. Pt reports her father tested positive for Covid yesterday. Pt lives in her father house. Pt denies any sign and symptoms.

## 2019-05-16 LAB — NOVEL CORONAVIRUS, NAA (HOSP ORDER, SEND-OUT TO REF LAB; TAT 18-24 HRS): SARS-CoV-2, NAA: NOT DETECTED

## 2019-06-12 ENCOUNTER — Telehealth (INDEPENDENT_AMBULATORY_CARE_PROVIDER_SITE_OTHER): Payer: Self-pay | Admitting: *Deleted

## 2019-06-12 ENCOUNTER — Ambulatory Visit (INDEPENDENT_AMBULATORY_CARE_PROVIDER_SITE_OTHER): Payer: Medicaid Other | Admitting: Pediatric Endocrinology

## 2019-06-12 ENCOUNTER — Other Ambulatory Visit: Payer: Self-pay

## 2019-06-12 ENCOUNTER — Encounter (INDEPENDENT_AMBULATORY_CARE_PROVIDER_SITE_OTHER): Payer: Self-pay | Admitting: Pediatric Endocrinology

## 2019-06-12 DIAGNOSIS — E6609 Other obesity due to excess calories: Secondary | ICD-10-CM

## 2019-06-12 DIAGNOSIS — N914 Secondary oligomenorrhea: Secondary | ICD-10-CM | POA: Diagnosis not present

## 2019-06-12 DIAGNOSIS — R7309 Other abnormal glucose: Secondary | ICD-10-CM

## 2019-06-12 DIAGNOSIS — Z68.41 Body mass index (BMI) pediatric, greater than or equal to 95th percentile for age: Secondary | ICD-10-CM | POA: Diagnosis not present

## 2019-06-12 DIAGNOSIS — Z6836 Body mass index (BMI) 36.0-36.9, adult: Secondary | ICD-10-CM

## 2019-06-12 LAB — POCT GLYCOSYLATED HEMOGLOBIN (HGB A1C): Hemoglobin A1C: 5.2 % (ref 4.0–5.6)

## 2019-06-12 LAB — POCT GLUCOSE (DEVICE FOR HOME USE): POC Glucose: 70 mg/dl (ref 70–99)

## 2019-06-12 MED ORDER — NORETHIN ACE-ETH ESTRAD-FE 1.5-30 MG-MCG PO TABS: 1.0000 | ORAL_TABLET | Freq: Every day | ORAL | 4 refills | Status: DC

## 2019-06-12 MED ORDER — SAXENDA 18 MG/3ML ~~LOC~~ SOPN: 1.8000 mg | PEN_INJECTOR | Freq: Every day | SUBCUTANEOUS | 6 refills | Status: DC

## 2019-06-12 MED ORDER — INSUPEN PEN NEEDLES 32G X 4 MM MISC: 6 refills | Status: DC

## 2019-06-12 NOTE — Telephone Encounter (Signed)
TC to [place] [facility] for PA for [place], DENIED  was informed that her Medicaid is ONLY for [employer]. Does not cover anything else.

## 2019-06-12 NOTE — Patient Instructions (Signed)
For Constipation-  Mix 14 caps of Miralax in 64 ounces of water or liquid of choice (NOT SODA!)  OK to take a Dulcolax about 4 hours prior to starting the Miralax.   After you are all cleaned out- continue on Mirilax EVERY DAY for 6 months. Goal is one soft stool per day.    For weight management-  Start Saxenda at 0.6 mg once a day.  Increase by 1 click every 2 weeks until you get to 1.2 mg If you feel that you are too queasy or ill with a dose- decrease by 1 click for a week and then try again to increase. If you get stuck- that's ok. Find the highest dose that you are comfortable with and stay there.   For Oligomenorrhea- Continue on Junel with 3 month cycling.   For Transition of Care- Make an appointment with [facility] with either Dr. Cruzita Lederer or Dr. Annetta Maw in 6 months.

## 2019-06-12 NOTE — Telephone Encounter (Signed)
YIKES! She thought she still had regular Medicaid. Thanks for the update.

## 2019-06-12 NOTE — Progress Notes (Signed)
Subjective:  Subjective  Patient Name: Diane BristolBrittany Mann Date of Birth: 2000-11-24  MRN: [id number]  Diane Mann  presents to the office today for follow up evaluation and management of her oligomenorrhea, insulin resistance, and pediatric obesity.  HISTORY OF PRESENT ILLNESS:   Diane Mann is a 18 y.o. AA female   Diane Mann was accompanied by her self    1. Diane Mann was seen by her PCP in June of 2015. At that visit they obtained screening labs and were concerned about a low free t4 with a normal TSH. They also were concerned about a possible diagnosis of Turner's syndrome. They referred her to endocrinology for further evaluation and management.    2. [name] was last seen in clinic on 07/10/18. She has been generally healthy.    She had a period in September. She continuous on the Junel with 1 period about every 3 months.   Sometimes she feels hungry and sometimes she doesn't. She feels that she has gained a lot of weight- she says that she was in a "deep depression". [facility] and is doing a lot better.   She is drinking water with occasional soda. She is not drinking Dr. Reino KentPepper but now she likes Pepsi.   She is not exercising. She doesn't remember how to do a lunge jack.   She is not currently taking Metformin (we stopped it at her last visit)     3. Pertinent Review of Systems:  Constitutional: The patient feels "good". The patient seems healthy and active. Eyes: Vision seems to be good. There are no recognized eye problems. Wears glasses  Neck: The patient has no complaints of anterior neck swelling, soreness, tenderness, pressure, discomfort, or difficulty swallowing.   Heart: Heart rate increases with exercise or other physical activity. The patient has no complaints of palpitations, irregular heart beats, chest pain, or chest pressure.   Lungs: no asthma or wheezing.  Gastrointestinal: Bowel movents seem normal. The patient has no  complaints of excessive hunger, acid reflux, upset stomach, stomach aches or pains, diarrhea. She is seeing some bright red blood when she wipes. She has occasional constipation. She has been using Miralax for the past 2 months most days. She is still seeing blood.  Legs: Muscle mass and strength seem normal. There are no complaints of numbness, tingling, burning, or pain. No edema is noted.  Feet: There are no obvious foot problems. There are no complaints of numbness, tingling, burning, or pain. No edema is noted. Neurologic: There are no recognized problems with muscle movement and strength, sensation, or coordination. GYN/GU: per HPI. LMP Sept 2020 Skin: no eczema, birth marks, or rashes.   PAST MEDICAL, FAMILY, AND SOCIAL HISTORY  Past Medical History:  Diagnosis Date  . Anxiety   . Asthma   . Asthma 04/24/2015  . 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 Congenital heart problem    blockage  . Headache   . Obesity   . Panic attacks 04/24/2015  . RBBB (right bundle branch block) 04/24/2015  . Social anxiety disorder 04/24/2015    Family History  Problem Relation Age of Onset  . Lupus Mother   . Heart disease Father      Current Outpatient Medications:  .  acetaminophen (TYLENOL) 325 MG tablet, Take 650 mg by mouth every 6 (six) hours as needed for mild pain or headache., Disp: , Rfl:  .  ARIPiprazole (ABILIFY) 5 MG tablet, Take 1 tablet (5 mg total)  by mouth at bedtime. For mood, Disp: 30 tablet, Rfl: 0 .  norethindrone-ethinyl estradiol-iron (MICROGESTIN FE 1.5/30) 1.5-30 MG-MCG tablet, Take 1 tablet by mouth daily. Skip placebo pills x 2 months. On the third month- take the placebo (brown) pills., Disp: 84 tablet, Rfl: 4 .  ondansetron (ZOFRAN-ODT) 8 MG disintegrating tablet, Take 1 tablet (8 mg total) by mouth every 8 (eight) hours as needed for nausea or vomiting., Disp: 20 tablet, Rfl: 0 .  traZODone (DESYREL) 50 MG tablet, Take 50-100 mg by mouth at bedtime as needed for  sleep., Disp: , Rfl:  .  albuterol (PROVENTIL) (2.5 MG/3ML) 0.083% nebulizer solution, Take 3 mLs (2.5 mg total) by nebulization every 6 (six) hours as needed for wheezing or shortness of breath., Disp: 75 mL, Rfl: 0 .  albuterol (VENTOLIN HFA) 108 (90 Base) MCG/ACT inhaler, Inhale 1-2 puffs into the lungs every 6 (six) hours as needed for wheezing or shortness of breath., Disp: 18 g, Rfl: 0 .  Insulin Pen Needle (INSUPEN PEN NEEDLES) 32G X 4 MM MISC, For use with Saxenda, Disp: 30 each, Rfl: 6 .  Liraglutide -Weight Management (SAXENDA) 18 MG/3ML SOPN, Inject 1.8 mg of oxycodone into the skin daily., Disp: 2 pen, Rfl: 6 .  predniSONE (STERAPRED UNI-PAK 21 TAB) 10 MG (21) TBPK tablet, Take by mouth daily. Take as directed on package, Disp: 21 tablet, Rfl: 0  Allergies as of 06/12/2019  . (No Known Allergies)     reports that she has been smoking. She has been smoking about 0.20 packs per day. She has never used smokeless tobacco. She reports that she does not drink alcohol or use drugs. Pediatric History  Patient Parents  . Stavely,Carla (Mother)   Other Topics Concern  . Not on file  Social History Narrative   Diane Mann lives with her mom and stepfather, 106 years old brother.  Mom works in housekeeping.  Diane Mann graduated from [employer]. Works at [employer] on [employer]     1. School and Family: Graduated   Lives with mom and brother Wants to study photography at [employer]. Working at [employer].  2. Activities: not active. Doing photography at church 3. Primary Care Provider: Patient, No Pcp Per  ROS: There are no other significant problems involving Ruey's other body systems.    Objective:  Objective  Vital Signs:  BP 108/72   Pulse 86   Wt 223 lb (101.2 kg)   BMI 42.14 kg/m   Growth percentile SmartLinks can only be used for patients less than 8 years old.  Ht Readings from Last 3 Encounters:  03/28/19 5\' 1"  (1.549 m)  08/19/18 5\' 1"  (1.549 m) (10 %, Z=  -1.29)*  07/10/18 5\' 1"  (1.549 m) (10 %, Z= -1.29)*   * Growth percentiles are based on CDC (Girls, 2-20 Years) data.   Wt Readings from Last 3 Encounters:  06/12/19 223 lb (101.2 kg)  03/28/19 195 lb (88.5 kg)  10/25/18 195 lb (88.5 kg) (97 %, Z= 1.86)*   * Growth percentiles are based on CDC (Girls, 2-20 Years) data.   HC Readings from Last 3 Encounters:  No data found for [facility]   Body surface area is 2.09 meters squared. Facility age limit for growth percentiles is 20 years. Facility age limit for growth percentiles is 20 years.   PHYSICAL EXAM:   Constitutional: The patient appears healthy and well nourished. The patient's height and weight are consistent with obesity for age. Recent weight gain Head: The  head is normocephalic. Face: The face appears normal. 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  Eyes: The eyes appear to be normally formed and spaced. Gaze is conjugate. There is no obvious arcus or proptosis. Moisture appears normal. Ears: The ears are normally placed and appear externally normal. Mouth: The oropharynx and tongue appear normal. Dentition appears to be normal for age. Oral moisture is normal. Neck: The neck appears to be visibly normal. The thyroid gland is 15 grams in size. The consistency of the thyroid gland is normal. The thyroid gland is not tender to palpation. +1 acanthosis Lungs: The lungs are clear to auscultation. Air movement is good. Heart: Heart rate and rhythm are regular. Heart sounds S1 and S2 are normal. I did not appreciate any pathologic cardiac murmurs. Abdomen: The abdomen appears to be enlarged in size for the patient's age. +stretch marks.  Bowel sounds are normal. There is no obvious hepatomegaly, splenomegaly, or other mass effect.  Trunk is long compared with legs.  Arms: Muscle size and bulk are normal for age. Hands: There is no obvious tremor. Phalangeal and metacarpophalangeal joints are normal. Palmar muscles are normal for age. Palmar  skin is normal. Palmar moisture is also normal. Hands are big compared to body.  Legs: Muscles appear normal for age. No edema is present. Feet: Feet are normally formed. Dorsalis pedal pulses are normal. Neurologic: Strength is normal for age in both the upper and lower extremities. Muscle tone is normal. Sensation to touch is normal in both the legs and feet.   Puberty: Tanner stage pubic hair: V Tanner stage breast/genital V.   LAB DATA:    Results for orders placed or performed in visit on 06/12/19  POCT HgB A1C  Result Value Ref Range   Hemoglobin A1C 5.2 4.0 - 5.6 %   HbA1c POC (<> result, manual entry)     HbA1c, POC (prediabetic range)     HbA1c, POC (controlled diabetic range)    POCT Glucose (Device for Home Use)  Result Value Ref Range   Glucose Fasting, POC     POC Glucose 70 70 - 99 mg/dl     Last A1C 5.3     Assessment and Plan:  Assessment  ASSESSMENT:  [name] is a 18 y.o. AA female with oligomenorrhea and pediatric obesity.   Pediatric Obesity - Has had recent weight gain - Taking Metformin 500 mg ER daily with food. She feels that this curbs her appetite - A1C has continued in normal range -Start Saxenda at 0.6 mg once a day.  - Increase by 1 click every 2 weeks until you get to 1.2 mg   Oligomenorrhea - Continue doing continuous cycling for 3 months at a time on Junel   PLAN:   1. Diagnostic: A1C as above 2. Therapeutic: Continue Junel fe. Will do continuous cycling with menses q 3 months.  Start [place] as discussed above 3. Patient education: All of the above discussed with patient. Questions answered.  4. Follow-up: Return in about 3 months (around 09/10/2019).      Lelon Huh, MD Level of Service: This visit lasted in excess of 25 minutes. More than 50% of the visit was devoted to counseling.

## 2019-06-13 ENCOUNTER — Encounter (HOSPITAL_COMMUNITY): Payer: Self-pay

## 2019-06-13 ENCOUNTER — Ambulatory Visit (HOSPITAL_COMMUNITY)
Admission: EM | Admit: 2019-06-13 | Discharge: 2019-06-13 | Disposition: A | Discharge status: Home / Self Care | Payer: Medicaid Other | Attending: Family Medicine | Admitting: Family Medicine

## 2019-06-13 ENCOUNTER — Other Ambulatory Visit: Payer: Self-pay

## 2019-06-13 DIAGNOSIS — F17200 Nicotine dependence, unspecified, uncomplicated: Secondary | ICD-10-CM

## 2019-06-13 DIAGNOSIS — Z20828 Contact with and (suspected) exposure to other viral communicable diseases: Secondary | ICD-10-CM | POA: Insufficient documentation

## 2019-06-13 DIAGNOSIS — J4521 Mild intermittent asthma with (acute) exacerbation: Secondary | ICD-10-CM | POA: Insufficient documentation

## 2019-06-13 DIAGNOSIS — R0602 Shortness of breath: Secondary | ICD-10-CM | POA: Insufficient documentation

## 2019-06-13 DIAGNOSIS — Z20822 Contact with and (suspected) exposure to covid-19: Secondary | ICD-10-CM

## 2019-06-13 MED ORDER — DEXAMETHASONE SODIUM PHOSPHATE 10 MG/ML IJ SOLN
10.0000 mg | Freq: Once | INTRAMUSCULAR | Status: AC
  Administered 2019-06-13: 10 mg via INTRAMUSCULAR

## 2019-06-13 MED ORDER — PREDNISONE 10 MG (21) PO TBPK: ORAL_TABLET | Freq: Every day | ORAL | 0 refills | Status: DC

## 2019-06-13 MED ORDER — DEXAMETHASONE SODIUM PHOSPHATE 10 MG/ML IJ SOLN
INTRAMUSCULAR | Status: AC
  Filled 2019-06-13: qty 1

## 2019-06-13 MED ORDER — ALBUTEROL SULFATE HFA 108 (90 BASE) MCG/ACT IN AERS: 1.0000 | INHALATION_SPRAY | Freq: Four times a day (QID) | RESPIRATORY_TRACT | 0 refills | Status: DC | PRN

## 2019-06-13 MED ORDER — ALBUTEROL SULFATE (2.5 MG/3ML) 0.083% IN NEBU: 2.5000 mg | INHALATION_SOLUTION | Freq: Four times a day (QID) | RESPIRATORY_TRACT | 0 refills | Status: DC | PRN

## 2019-06-13 NOTE — ED Notes (Signed)
[facility] sent a 90 day request for albuterol inhaler.  hallie wieters, pa did sign form and this nurse faxed back to pharmacy.

## 2019-06-13 NOTE — Discharge Instructions (Signed)
COVID swab pending, quarantine until results return Continue to use albuterol inhaler or may use albuterol nebulizers as needed for shortness of breath and wheezing We gave you a shot of Decadron today, continue with prednisone taper beginning tomorrow morning.  Decrease by 1 tablet each day starting with 6 tablets.  Please follow-up if symptoms not resolving or worsening, developing increase shortness of breath, chest tightness or difficulty breathing, fevers

## 2019-06-13 NOTE — ED Triage Notes (Signed)
Pt presents with asthma flare up and shortness of breath since waking up this morning; pt states she has used her inhaler and nebulizer and has not got any relief.  Pt has Hx of asthma

## 2019-06-14 NOTE — ED Provider Notes (Signed)
[place]    CSN: [id number] Arrival date & time: 06/13/19  1719      History   Chief Complaint Chief Complaint  Patient presents with  . Asthma  . Shortness of Breath    HPI Diane Mann is a 18 y.o. female history of asthma, anxiety, depression, presenting today for evaluation of shortness of breath.  Patient states that since waking up this morning she has felt her asthma has been flared.  She has had wheezing and shortness of breath along with some chest tightness.  She denies any fevers chills or body aches.  Denies any congestion or sore throat.  Feels similar to prior asthma flares.  She has been using her albuterol inhaler without relief.  Typically has 2-3 flares per year.  Denies any known exposure to Covid.  Denies close sick contacts.  HPI  Past Medical History:  Diagnosis Date  . Anxiety   . Asthma   . Asthma 04/24/2015  . 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 Congenital heart problem    blockage  . Headache   . Obesity   . Panic attacks 04/24/2015  . RBBB (right bundle branch block) 04/24/2015  . Social anxiety disorder 04/24/2015    Patient Active Problem List   Diagnosis Date Noted  . Major depressive disorder, recurrent episode, severe ([place]) 08/19/2018  . Morbid obesity with body mass index (BMI) greater than 99th percentile for age in childhood ([place]) 05/19/2016  . Social anxiety disorder 04/24/2015  . Panic attacks 04/24/2015  . RBBB (right bundle branch block) 04/24/2015  . 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 Asthma 04/24/2015  . Oligomenorrhea 04/09/2015  . Thyroid function test abnormal 08/05/2014  . 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 Obesity 08/05/2014  . Short stature 08/05/2014    Past Surgical History:  Procedure Laterality Date  . MOUTH SURGERY  05/04/2017   Wisdom teeth ectraction Bilateral     OB History   No obstetric history on file.      Home Medications    Prior to Admission medications   Medication Sig  Start Date End Date Taking? Authorizing Provider  acetaminophen (TYLENOL) 325 MG tablet Take 650 mg by mouth every 6 (six) hours as needed for mild pain or headache.    [provider]  albuterol (PROVENTIL) (2.5 MG/3ML) 0.083% nebulizer solution Take 3 mLs (2.5 mg total) by nebulization every 6 (six) hours as needed for wheezing or shortness of breath. 06/13/19   Zamiyah Resendes C, PA-C  albuterol (VENTOLIN HFA) 108 (90 Base) MCG/ACT inhaler Inhale 1-2 puffs into the lungs every 6 (six) hours as needed for wheezing or shortness of breath. 06/13/19   Trenity Pha C, PA-C  ARIPiprazole (ABILIFY) 5 MG tablet Take 1 tablet (5 mg total) by mouth at bedtime. For mood 08/24/18   Connye Burkitt, NP  Insulin Pen Needle (INSUPEN PEN NEEDLES) 32G X 4 MM MISC For use with Saxenda 06/12/19   Lelon Huh, MD  Liraglutide -Weight Management (SAXENDA) 18 MG/3ML SOPN Inject 1.8 mg of oxycodone into the skin daily. 06/12/19   Lelon Huh, MD  norethindrone-ethinyl estradiol-iron (MICROGESTIN FE 1.5/30) 1.5-30 MG-MCG tablet Take 1 tablet by mouth daily. Skip placebo pills x 2 months. On the third month- take the placebo (brown) pills. 06/12/19   Lelon Huh, MD  ondansetron (ZOFRAN-ODT) 8 MG disintegrating tablet Take 1 tablet (8 mg total) by mouth every 8 (eight) hours as needed for nausea or vomiting. 03/20/19   Bess Harvest,  Marquita PalmsMario, PA-C  predniSONE (STERAPRED UNI-PAK 21 TAB) 10 MG (21) TBPK tablet Take by mouth daily. Take as directed on package 06/13/19   Evoleht Hovatter C, PA-C  traZODone (DESYREL) 50 MG tablet Take 50-100 mg by mouth at bedtime as needed for sleep.    [provider]  citalopram (CELEXA) 10 MG tablet Take 1 tablet (10 mg total) by mouth daily. For mood 08/25/18 03/05/19  Aldean BakerSykes, Janet E, NP  zolpidem (AMBIEN) 5 MG tablet Take 1 tablet (5 mg total) by mouth at bedtime as needed for sleep. 08/24/18 03/05/19  Aldean BakerSykes, Janet E, NP    Family History Family History  Problem Relation Age  of Onset  . Lupus Mother   . Heart disease Father     Social History Social History   Tobacco Use  . Smoking status: Current Every Day Smoker    Packs/day: 0.20  . Smokeless tobacco: Never Used  . Tobacco comment:  5 perday  Substance Use Topics  . Alcohol use: No  . Drug use: No     Allergies   Patient has no known allergies.   Review of Systems Review of Systems  Constitutional: Negative for activity change, appetite change, chills, fatigue and fever.  HENT: Negative for congestion, ear pain, rhinorrhea, sinus pressure, sore throat and trouble swallowing.   Eyes: Negative for discharge and redness.  Respiratory: Positive for cough, chest tightness, shortness of breath and wheezing.   Cardiovascular: Negative for chest pain.  Gastrointestinal: Negative for abdominal pain, diarrhea, nausea and vomiting.  Musculoskeletal: Negative for myalgias.  Skin: Negative for rash.  Neurological: Negative for dizziness, light-headedness and headaches.     Physical Exam Triage Vital Signs ED Triage Vitals [06/13/19 1915]  Enc Vitals Group     BP 123/75     Pulse Rate 90     Resp 16     Temp 99.2 F (37.3 C)     Temp Source Oral     SpO2 97 %     Weight      Height      Head Circumference      Peak Flow      Pain Score 3     Pain Loc      Pain Edu?      Excl. in [place]?    No data found.  Updated Vital Signs BP 123/75 (BP Location: Right Arm)   Pulse 90   Temp 99.2 F (37.3 C) (Oral)   Resp 16   SpO2 97%   Visual Acuity Right Eye Distance:   Left Eye Distance:   Bilateral Distance:    Right Eye Near:   Left Eye Near:    Bilateral Near:     Physical Exam Vitals and nursing note reviewed.  Constitutional:      General: She is not in acute distress.    Appearance: She is well-developed.  HENT:     Head: Normocephalic and atraumatic.     Ears:     Comments: Bilateral ears without tenderness to palpation of external auricle, tragus and mastoid, EAC's  without erythema or swelling, TM's with good bony landmarks and cone of light. Non erythematous.     Mouth/Throat:     Comments: Oral mucosa pink and moist, no tonsillar enlargement or exudate. Posterior pharynx patent and nonerythematous, no uvula deviation or swelling. Normal phonation. Eyes:     Conjunctiva/sclera: Conjunctivae normal.  Cardiovascular:     Rate and Rhythm: Normal rate and regular rhythm.  Heart sounds: No murmur.  Pulmonary:     Effort: Pulmonary effort is normal. No respiratory distress.     Breath sounds: Normal breath sounds.     Comments: Breathing comfortably at rest, CTABL, no wheezing, rales or other adventitious sounds auscultated  No audible wheezing at time of visit Abdominal:     Palpations: Abdomen is soft.     Tenderness: There is no abdominal tenderness.  Musculoskeletal:     Cervical back: Neck supple.  Skin:    General: Skin is warm and dry.  Neurological:     Mental Status: She is alert.      UC Treatments / Results  Labs (all labs ordered are listed, but only abnormal results are displayed) Labs Reviewed  NOVEL CORONAVIRUS, NAA (HOSP ORDER, SEND-OUT TO REF LAB; TAT 18-24 HRS)    EKG   Radiology No results found.  Procedures Procedures (including critical care time)  Medications Ordered in UC Medications  dexamethasone (DECADRON) injection 10 mg (10 mg Intramuscular Given 06/13/19 1947)    Initial Impression / Assessment and Plan / UC Course  I have reviewed the triage vital signs and the nursing notes.  Pertinent labs & imaging results that were available during my care of the patient were reviewed by me and considered in my medical decision making (see chart for details).     Covid PCR pending, given patient's symptoms earlier today feeling similar to prior asthma flares will provide Decadron IM today followed by short course of prednisone.  Refilled albuterol inhaler as well as provided nebulizer machine to use  albuterol nebs at home.  Recommended quarantine until Covid PCR returns to ensure symptoms not viral.Discussed strict return precautions. Patient verbalized understanding and is agreeable with plan.  Final Clinical Impressions(s) / UC Diagnoses   Final diagnoses:  Shortness of breath  Mild intermittent asthma with exacerbation  Encounter for laboratory testing for COVID-19 virus     Discharge Instructions     COVID swab pending, quarantine until results return Continue to use albuterol inhaler or may use albuterol nebulizers as needed for shortness of breath and wheezing We gave you a shot of Decadron today, continue with prednisone taper beginning tomorrow morning.  Decrease by 1 tablet each day starting with 6 tablets.  Please follow-up if symptoms not resolving or worsening, developing increase shortness of breath, chest tightness or difficulty breathing, fevers   ED Prescriptions    Medication Sig Dispense Auth. Provider   predniSONE (STERAPRED UNI-PAK 21 TAB) 10 MG (21) TBPK tablet  (Status: Discontinued) Take by mouth daily. Take as directed on package 21 tablet Kesler Wickham C, PA-C   albuterol (VENTOLIN HFA) 108 (90 Base) MCG/ACT inhaler  (Status: Discontinued) Inhale 1-2 puffs into the lungs every 6 (six) hours as needed for wheezing or shortness of breath. 18 g Suhaan Perleberg C, PA-C   albuterol (PROVENTIL) (2.5 MG/3ML) 0.083% nebulizer solution  (Status: Discontinued) Take 3 mLs (2.5 mg total) by nebulization every 6 (six) hours as needed for wheezing or shortness of breath. 75 mL Hazim Treadway C, PA-C   albuterol (PROVENTIL) (2.5 MG/3ML) 0.083% nebulizer solution Take 3 mLs (2.5 mg total) by nebulization every 6 (six) hours as needed for wheezing or shortness of breath. 75 mL Isahi Godwin C, PA-C   albuterol (VENTOLIN HFA) 108 (90 Base) MCG/ACT inhaler Inhale 1-2 puffs into the lungs every 6 (six) hours as needed for wheezing or shortness of breath. 18 g Shrihaan Porzio  C, PA-C   predniSONE (STERAPRED  UNI-PAK 21 TAB) 10 MG (21) TBPK tablet Take by mouth daily. Take as directed on package 21 tablet Tanner Vigna, [place] C, PA-C     PDMP not reviewed this encounter.   Lew Dawes, PA-C 06/14/19 1007

## 2019-06-15 LAB — NOVEL CORONAVIRUS, NAA (HOSP ORDER, SEND-OUT TO REF LAB; TAT 18-24 HRS): SARS-CoV-2, NAA: NOT DETECTED

## 2019-06-18 ENCOUNTER — Telehealth (INDEPENDENT_AMBULATORY_CARE_PROVIDER_SITE_OTHER): Payer: Self-pay | Admitting: Pediatric Endocrinology

## 2019-06-18 NOTE — Telephone Encounter (Signed)
  Who's calling (name and relationship to patient) : Diane Mann   Best contact number: [phone]  Provider they see: Dr Baldo Ash   Reason for call: Patient called to advise that Medicaid will not cover her pen refill. Please call patient to discuss options. She tried to pick up the refill today    PRESCRIPTION REFILL ONLY  Name of prescription:  Pharmacy:

## 2019-06-18 NOTE — Telephone Encounter (Signed)
Please advise. Her medicaid is family planning

## 2019-06-19 NOTE — Telephone Encounter (Signed)
Can you please let her know that without insurance she will not be able to get Saxenda.   Thanks   Dr Baldo Ash

## 2019-06-25 NOTE — Telephone Encounter (Signed)
Left number with mom for [name] to call us back.

## 2019-06-26 NOTE — Telephone Encounter (Signed)
Spoke with patient. I let her know that with her family planning medicaid that that type of medicaid will not pay for the Saxenda. She verbally understands.

## 2019-09-19 ENCOUNTER — Ambulatory Visit (INDEPENDENT_AMBULATORY_CARE_PROVIDER_SITE_OTHER): Payer: Medicaid Other | Admitting: Pediatric Endocrinology

## 2019-10-01 ENCOUNTER — Encounter (HOSPITAL_COMMUNITY): Payer: Self-pay

## 2019-10-01 ENCOUNTER — Other Ambulatory Visit: Payer: Self-pay

## 2019-10-01 ENCOUNTER — Ambulatory Visit (HOSPITAL_COMMUNITY)
Admission: EM | Admit: 2019-10-01 | Discharge: 2019-10-01 | Disposition: A | Discharge status: Home / Self Care | Payer: Medicaid Other | Attending: Physician Assistant | Admitting: Physician Assistant

## 2019-10-01 DIAGNOSIS — R42 Dizziness and giddiness: Secondary | ICD-10-CM | POA: Diagnosis not present

## 2019-10-01 DIAGNOSIS — Z794 Long term (current) use of insulin: Secondary | ICD-10-CM | POA: Diagnosis not present

## 2019-10-01 DIAGNOSIS — Z793 Long term (current) use of hormonal contraceptives: Secondary | ICD-10-CM | POA: Diagnosis not present

## 2019-10-01 DIAGNOSIS — R112 Nausea with vomiting, unspecified: Secondary | ICD-10-CM | POA: Insufficient documentation

## 2019-10-01 DIAGNOSIS — H60391 Other infective otitis externa, right ear: Secondary | ICD-10-CM | POA: Insufficient documentation

## 2019-10-01 DIAGNOSIS — Z6841 Body Mass Index (BMI) 40.0 and over, adult: Secondary | ICD-10-CM | POA: Insufficient documentation

## 2019-10-01 DIAGNOSIS — F332 Major depressive disorder, recurrent severe without psychotic features: Secondary | ICD-10-CM | POA: Diagnosis not present

## 2019-10-01 DIAGNOSIS — R519 Headache, unspecified: Secondary | ICD-10-CM | POA: Insufficient documentation

## 2019-10-01 DIAGNOSIS — F419 Anxiety disorder, unspecified: Secondary | ICD-10-CM | POA: Diagnosis not present

## 2019-10-01 DIAGNOSIS — F17210 Nicotine dependence, cigarettes, uncomplicated: Secondary | ICD-10-CM | POA: Insufficient documentation

## 2019-10-01 DIAGNOSIS — Z79899 Other long term (current) drug therapy: Secondary | ICD-10-CM | POA: Insufficient documentation

## 2019-10-01 DIAGNOSIS — Z20822 Contact with and (suspected) exposure to covid-19: Secondary | ICD-10-CM | POA: Diagnosis not present

## 2019-10-01 DIAGNOSIS — J45909 Unspecified asthma, uncomplicated: Secondary | ICD-10-CM | POA: Insufficient documentation

## 2019-10-01 DIAGNOSIS — Z8249 Family history of ischemic heart disease and other diseases of the circulatory system: Secondary | ICD-10-CM | POA: Insufficient documentation

## 2019-10-01 MED ORDER — HYDROCORTISONE-ACETIC ACID 1-2 % OT SOLN: 4.0000 [drp] | Freq: Three times a day (TID) | OTIC | 0 refills | Status: AC

## 2019-10-01 MED ORDER — ONDANSETRON 4 MG PO TBDP
4.0000 mg | ORAL_TABLET | Freq: Once | ORAL | Status: AC
  Administered 2019-10-01: 4 mg via ORAL

## 2019-10-01 MED ORDER — ONDANSETRON 4 MG PO TBDP
ORAL_TABLET | ORAL | Status: AC
  Filled 2019-10-01: qty 1

## 2019-10-01 MED ORDER — KETOROLAC TROMETHAMINE 60 MG/2ML IM SOLN
60.0000 mg | Freq: Once | INTRAMUSCULAR | Status: AC
  Administered 2019-10-01: 11:00:00 60 mg via INTRAMUSCULAR

## 2019-10-01 MED ORDER — ACETAMINOPHEN 325 MG PO TABS: 650.0000 mg | ORAL_TABLET | Freq: Four times a day (QID) | ORAL | 0 refills | Status: DC | PRN

## 2019-10-01 MED ORDER — KETOROLAC TROMETHAMINE 60 MG/2ML IM SOLN
INTRAMUSCULAR | Status: AC
  Filled 2019-10-01: qty 2

## 2019-10-01 MED ORDER — ONDANSETRON HCL 4 MG PO TABS: 4.0000 mg | ORAL_TABLET | Freq: Three times a day (TID) | ORAL | 0 refills | Status: DC | PRN

## 2019-10-01 MED ORDER — MECLIZINE HCL 50 MG PO TABS: 50.0000 mg | ORAL_TABLET | Freq: Three times a day (TID) | ORAL | 0 refills | Status: DC | PRN

## 2019-10-01 MED ORDER — FAMOTIDINE 20 MG PO TABS: 20.0000 mg | ORAL_TABLET | Freq: Two times a day (BID) | ORAL | 0 refills | Status: DC

## 2019-10-01 NOTE — ED Provider Notes (Signed)
[facility]    CSN: [id number] Arrival date & time: 10/01/19  1103      History   Chief Complaint Chief Complaint  Patient presents with  . Nausea    HPI Diane Mann is a 19 y.o. female.   Patient presents urgent care for complaint of nausea and vomiting.  She reports the nausea began last night around 9 PM and was followed by 3 episodes of vomiting.  She reports of vomiting last night was once an hour.  She reports initial vomiting was her food from dinner.  She reports she ate chicken rice with her family however notes that nobody else is sick.  She reports she was able to sleep however woke this morning with severe frontal headache with some dizziness as well as nausea and continued vomiting.  She reports light does make the head ache worse a little bit.  Upon further clarification she does endorse that there is both a room spinning sensation occasionally as well as a lightheaded feeling.  She reports she vomited twice this morning.  Has been no blood vomit.  She does report some decreased appetite and has not tried to eat this morning.  She denies fever or chills.  Denies sore throat, cough, nasal congestion.   She also reports right-sided ear pain for 1 week.     Past Medical History:  Diagnosis Date  . Anxiety   . Asthma   . Asthma 04/24/2015  . 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 Congenital heart problem    blockage  . Headache   . Obesity   . Panic attacks 04/24/2015  . RBBB (right bundle branch block) 04/24/2015  . Social anxiety disorder 04/24/2015    Patient Active Problem List   Diagnosis Date Noted  . Major depressive disorder, recurrent episode, severe ([facility]) 08/19/2018  . 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 Social anxiety disorder 04/24/2015  . Panic attacks 04/24/2015  . RBBB (right bundle branch block) 04/24/2015  . 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 Asthma  04/24/2015  . Oligomenorrhea 04/09/2015  . Thyroid function test abnormal 08/05/2014  . 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 Obesity 08/05/2014  . Short stature 08/05/2014    Past Surgical History:  Procedure Laterality Date  . MOUTH SURGERY  05/04/2017   Wisdom teeth ectraction Bilateral     OB History   No obstetric history on file.      Home Medications    Prior to Admission medications   Medication Sig Start Date End Date Taking? Authorizing Provider  acetaminophen (TYLENOL) 325 MG tablet Take 2 tablets (650 mg total) by mouth every 6 (six) hours as needed. 10/01/19   Brittiany Wiehe, Veryl Speak, PA-C  acetic acid-hydrocortisone (VOSOL-HC) OTIC solution Place 4 drops into the right ear 3 (three) times daily for 10 days. 10/01/19 10/11/19  Iantha Titsworth, Veryl Speak, PA-C  albuterol (PROVENTIL) (2.5 MG/3ML) 0.083% nebulizer solution Take 3 mLs (2.5 mg total) by nebulization every 6 (six) hours as needed for wheezing or shortness of breath. 06/13/19   Wieters, Hallie C, PA-C  albuterol (VENTOLIN HFA) 108 (90 Base) MCG/ACT inhaler Inhale 1-2 puffs into the lungs every 6 (six) hours as needed for wheezing or shortness of breath. 06/13/19   Wieters, Hallie C, PA-C  ARIPiprazole (ABILIFY) 5 MG tablet Take 1 tablet (5 mg total) by mouth at bedtime. For mood 08/24/18   Aldean Baker, NP  famotidine (PEPCID) 20 MG tablet Take 1 tablet (20 mg total) by mouth 2 (two) times daily. 10/01/19   Lukka Black, Veryl SpeakJacob E, PA-C  Insulin Pen Needle (INSUPEN PEN NEEDLES) 32G X 4 MM MISC For use with Saxenda 06/12/19   Dessa PhiBadik, Jennifer, MD  Liraglutide -Weight Management (SAXENDA) 18 MG/3ML SOPN Inject 1.8 mg of oxycodone into the skin daily. 06/12/19   Dessa PhiBadik, Jennifer, MD  meclizine (ANTIVERT) 50 MG tablet Take 1 tablet (50 mg total) by mouth 3 (three) times daily as needed. 10/01/19   Myka Lukins, Veryl SpeakJacob E, PA-C  norethindrone-ethinyl estradiol-iron (MICROGESTIN FE 1.5/30) 1.5-30 MG-MCG tablet Take 1 tablet by mouth daily. Skip placebo pills x 2 months. On  the third month- take the placebo (brown) pills. 06/12/19   Dessa PhiBadik, Jennifer, MD  ondansetron (ZOFRAN) 4 MG tablet Take 1 tablet (4 mg total) by mouth every 8 (eight) hours as needed for nausea or vomiting. 10/01/19   Annikah Lovins, Veryl SpeakJacob E, PA-C  predniSONE (STERAPRED UNI-PAK 21 TAB) 10 MG (21) TBPK tablet Take by mouth daily. Take as directed on package 06/13/19   Wieters, Hallie C, PA-C  traZODone (DESYREL) 50 MG tablet Take 50-100 mg by mouth at bedtime as needed for sleep.    [provider]  citalopram (CELEXA) 10 MG tablet Take 1 tablet (10 mg total) by mouth daily. For mood 08/25/18 03/05/19  Aldean BakerSykes, Janet E, NP  zolpidem (AMBIEN) 5 MG tablet Take 1 tablet (5 mg total) by mouth at bedtime as needed for sleep. 08/24/18 03/05/19  Aldean BakerSykes, Janet E, NP    Family History Family History  Problem Relation Age of Onset  . Lupus Mother   . Heart disease Father     Social History Social History   Tobacco Use  . Smoking status: Current Every Day Smoker    Packs/day: 0.20  . Smokeless tobacco: Never Used  . Tobacco comment:  5 perday  Substance Use Topics  . Alcohol use: No  . Drug use: No     Allergies   Patient has no known allergies.   Review of Systems Review of Systems  Constitutional: Negative for chills, fatigue and fever.  HENT: Positive for ear pain. Negative for congestion, ear discharge, hearing loss, postnasal drip, rhinorrhea, sinus pressure, sinus pain and sore throat.   Eyes: Positive for photophobia. Negative for pain and visual disturbance.  Respiratory: Negative for cough and shortness of breath.   Cardiovascular: Negative for chest pain and palpitations.  Gastrointestinal: Positive for nausea and vomiting. Negative for abdominal pain, constipation and diarrhea.  Genitourinary: Negative for dysuria and hematuria.  Musculoskeletal: Negative for arthralgias, back pain and myalgias.  Skin: Negative for color change and rash.  Neurological: Positive for dizziness,  light-headedness and headaches. Negative for seizures, syncope, facial asymmetry, speech difficulty and weakness.  All other systems reviewed and are negative.    Physical Exam Triage Vital Signs ED Triage Vitals  Enc Vitals Group     BP 10/01/19 0913 118/69     Pulse Rate 10/01/19 0913 78     Resp 10/01/19 0913 16     Temp 10/01/19 0913 98.6 F (37 C)     Temp src --      SpO2 10/01/19 0913 94 %     Weight 10/01/19 0916 230 lb (104.3 kg)     Height 10/01/19 0916 5\' 1"  (1.549 m)     Head Circumference --      Peak Flow --      Pain Score 10/01/19 0916  0     Pain Loc --      Pain Edu? --      Excl. in [place]? --    No data found.  Updated Vital Signs BP 118/69   Pulse 78   Temp 98.6 F (37 C)   Resp 16   Ht 5\' 1"  (1.549 m)   Wt 230 lb (104.3 kg)   SpO2 94%   BMI 43.46 kg/m   Visual Acuity Right Eye Distance:   Left Eye Distance:   Bilateral Distance:    Right Eye Near:   Left Eye Near:    Bilateral Near:     Physical Exam Vitals and nursing note reviewed.  Constitutional:      General: She is not in acute distress.    Appearance: She is well-developed.  HENT:     Head: Normocephalic and atraumatic.     Comments: Frontal tenderness.    Ears:     Comments: Right ear canal with erythematous mildly swollen.  Tympanic membrane without erythema or swelling.  Left canal normal.  TM normal    Nose: Nose normal.     Mouth/Throat:     Mouth: Mucous membranes are moist.     Pharynx: Oropharynx is clear.  Eyes:     Extraocular Movements: Extraocular movements intact.     Conjunctiva/sclera: Conjunctivae normal.     Pupils: Pupils are equal, round, and reactive to light.  Cardiovascular:     Rate and Rhythm: Normal rate and regular rhythm.     Heart sounds: No murmur.  Pulmonary:     Effort: Pulmonary effort is normal. No respiratory distress.     Breath sounds: Normal breath sounds.  Abdominal:     Palpations: Abdomen is soft.     Tenderness: There is no  abdominal tenderness. There is no right CVA tenderness or left CVA tenderness.  Musculoskeletal:     Cervical back: Neck supple.  Skin:    General: Skin is warm and dry.  Neurological:     General: No focal deficit present.     Mental Status: She is alert and oriented to person, place, and time.     Comments: Patient alert and oriented to person place and time.  Cranial nerves intact all to confrontation.  Pupils are equal and reactive to light.  Cerebellar function intact with finger-to-nose and rapid alternating movement.  Romberg test normal.  Patient does report a little dizzy sensation with eyes closed and standing.  She does report increased dizziness with supine position.  Psychiatric:        Mood and Affect: Mood normal.        Behavior: Behavior normal.        Thought Content: Thought content normal.        Judgment: Judgment normal.      UC Treatments / Results  Labs (all labs ordered are listed, but only abnormal results are displayed) Labs Reviewed  SARS CORONAVIRUS 2 (TAT 6-24 HRS)    EKG   Radiology No results found.  Procedures Procedures (including critical care time)  Medications Ordered in UC Medications  ketorolac (TORADOL) injection 60 mg (60 mg Intramuscular Given 10/01/19 1030)  ondansetron (ZOFRAN-ODT) disintegrating tablet 4 mg (4 mg Oral Given 10/01/19 1030)    Initial Impression / Assessment and Plan / UC Course  I have reviewed the triage vital signs and the nursing notes.  Pertinent labs & imaging results that were available during my care of the patient were reviewed  by me and considered in my medical decision making (see chart for details).     #Headache #Nausea with vomiting #Otitis externa Patient is 19 year old female presenting with history of nausea, vomiting and onset of headache.  She also has evidence of otitis externa in the right ear.  Differential would be viral illness vs foodborne vs vertiginous migraine.  More likely this is a  viral illness or foodborne with element of headache after onset of nausea and vomiting.  Though she may have had a vertiginous migraine triggered.  We will treat her headache and nausea, and meclizine for dizziness and send out with eardrops, with strict return precautions of if she continues to have severe headache or worsening dizziness to return or report to the emergency department.  She is also to report to emergency department if she is unable to tolerate liquids for more than 24 hours. -Pepcid also prescribed to aid in stomach discomfort. -Patient discussed with supervising physician.  Final Clinical Impressions(s) / UC Diagnoses   Final diagnoses:  Acute nonintractable headache, unspecified headache type  Non-intractable vomiting with nausea, unspecified vomiting type  Infective otitis externa of right ear     Discharge Instructions     Use the Zofran for nausea up to every 8 hours.  Use the meclizine for your dizziness.  Also take the Pepcid twice a day.  I want you to try to sip water and eat small meals as tolerated.  Use the eardrops in the right ear 4 drops 3 times a day until symptoms resolve.  Take Tylenol for your headache.  If your headache becomes significantly worse or dizziness gets a lot worse or you are not able to tolerate fluids for 24 hours I want you to go to the emergency department.  If your Covid-19 test is positive, you will receive a phone call from [facility] regarding your results. Negative test results are not called. Both positive and negative results area always visible on MyChart. If you do not have a MyChart account, sign up instructions are in your discharge papers.   Persons who are directed to care for themselves at home may discontinue isolation under the following conditions:  . At least 10 days have passed since symptom onset and . At least 24 hours have passed without running a fever (this means without the use of fever-reducing  medications) and . Other symptoms have improved.  Persons infected with COVID-19 who never develop symptoms may discontinue isolation and other precautions 10 days after the date of their first positive COVID-19 test.       ED Prescriptions    Medication Sig Dispense Auth. Provider   ondansetron (ZOFRAN) 4 MG tablet Take 1 tablet (4 mg total) by mouth every 8 (eight) hours as needed for nausea or vomiting. 4 tablet Alie Moudy, Veryl Speak, PA-C   acetic acid-hydrocortisone (VOSOL-HC) OTIC solution Place 4 drops into the right ear 3 (three) times daily for 10 days. 6 mL Corbett Moulder, Veryl Speak, PA-C   famotidine (PEPCID) 20 MG tablet Take 1 tablet (20 mg total) by mouth 2 (two) times daily. 30 tablet Jahmeek Shirk, Veryl Speak, PA-C   acetaminophen (TYLENOL) 325 MG tablet Take 2 tablets (650 mg total) by mouth every 6 (six) hours as needed. 30 tablet Alynah Schone, Veryl Speak, PA-C   meclizine (ANTIVERT) 50 MG tablet Take 1 tablet (50 mg total) by mouth 3 (three) times daily as needed. 30 tablet Reylynn Vanalstine, Veryl Speak, PA-C     PDMP not reviewed this encounter.  Hermelinda Medicus, PA-C 10/02/19 [id number]

## 2019-10-01 NOTE — ED Triage Notes (Signed)
Pt states she woke up this morning w/N/V and dizziness. Pt states she vomited twice today. Pt denies diarrhea.

## 2019-10-01 NOTE — Discharge Instructions (Addendum)
Use the Zofran for nausea up to every 8 hours.  Use the meclizine for your dizziness.  Also take the Pepcid twice a day.  I want you to try to sip water and eat small meals as tolerated.  Use the eardrops in the right ear 4 drops 3 times a day until symptoms resolve.  Take Tylenol for your headache.  If your headache becomes significantly worse or dizziness gets a lot worse or you are not able to tolerate fluids for 24 hours I want you to go to the emergency department.  If your Covid-19 test is positive, you will receive a phone call from [facility] regarding your results. Negative test results are not called. Both positive and negative results area always visible on MyChart. If you do not have a MyChart account, sign up instructions are in your discharge papers.   Persons who are directed to care for themselves at home may discontinue isolation under the following conditions:   At least 10 days have passed since symptom onset and  At least 24 hours have passed without running a fever (this means without the use of fever-reducing medications) and  Other symptoms have improved.  Persons infected with COVID-19 who never develop symptoms may discontinue isolation and other precautions 10 days after the date of their first positive COVID-19 test.

## 2019-10-02 LAB — SARS CORONAVIRUS 2 (TAT 6-24 HRS): SARS Coronavirus 2: NEGATIVE

## 2019-10-25 ENCOUNTER — Ambulatory Visit (INDEPENDENT_AMBULATORY_CARE_PROVIDER_SITE_OTHER): Payer: Medicaid Other | Admitting: Pediatric Endocrinology

## 2019-11-05 ENCOUNTER — Encounter (HOSPITAL_COMMUNITY): Payer: Self-pay

## 2019-11-05 ENCOUNTER — Ambulatory Visit (HOSPITAL_COMMUNITY)
Admission: EM | Admit: 2019-11-05 | Discharge: 2019-11-05 | Disposition: A | Discharge status: Home / Self Care | Payer: Medicaid Other | Attending: Family Medicine | Admitting: Family Medicine

## 2019-11-05 ENCOUNTER — Other Ambulatory Visit: Payer: Self-pay

## 2019-11-05 DIAGNOSIS — R1031 Right lower quadrant pain: Secondary | ICD-10-CM | POA: Diagnosis not present

## 2019-11-05 LAB — POCT URINALYSIS DIP (DEVICE)
Bilirubin Urine: NEGATIVE
Glucose, UA: NEGATIVE mg/dL
Hgb urine dipstick: NEGATIVE
Ketones, ur: NEGATIVE mg/dL
Nitrite: NEGATIVE
Protein, ur: NEGATIVE mg/dL
Specific Gravity, Urine: >=1.03 (ref 1.005–1.030)
Urobilinogen, UA: 0.2 mg/dL (ref 0.0–1.0)
pH: 5.5 (ref 5.0–8.0)

## 2019-11-05 LAB — POC URINE PREG, ED: Preg Test, Ur: NEGATIVE

## 2019-11-05 NOTE — ED Provider Notes (Signed)
[facility]    CSN: [id number] Arrival date & time: 11/05/19  1707      History   Chief Complaint Chief Complaint  Patient presents with  . Abdominal Pain    HPI Diane Mann is a 19 y.o. female.   She is presenting with right lower quadrant pain but extends to the suprapubic area as well as the left lower quadrant.  The pain seems to be worse as of late.  Is occurring more frequently.  No history of abdominal surgery.  It seems to not be associated with bowel movements or urination.  No fevers.  HPI  Past Medical History:  Diagnosis Date  . Anxiety   . Asthma   . Asthma 04/24/2015  . 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 Congenital heart problem    blockage  . Headache   . Obesity   . Panic attacks 04/24/2015  . RBBB (right bundle branch block) 04/24/2015  . Social anxiety disorder 04/24/2015    Patient Active Problem List   Diagnosis Date Noted  . Major depressive disorder, recurrent episode, severe (HCC) 08/19/2018  . 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 Social anxiety disorder 04/24/2015  . Panic attacks 04/24/2015  . RBBB (right bundle branch block) 04/24/2015  . 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 Asthma 04/24/2015  . Oligomenorrhea 04/09/2015  . Thyroid function test abnormal 08/05/2014  . 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 Obesity 08/05/2014  . Short stature 08/05/2014    Past Surgical History:  Procedure Laterality Date  . MOUTH SURGERY  05/04/2017   Wisdom teeth ectraction Bilateral     OB History   No obstetric history on file.      Home Medications    Prior to Admission medications   Medication Sig Start Date End Date Taking? Authorizing Provider  acetaminophen (TYLENOL) 325 MG tablet Take 2 tablets (650 mg total) by mouth every 6 (six) hours as needed. 10/01/19   Darr, Veryl Speak, PA-C  albuterol (PROVENTIL) (2.5 MG/3ML) 0.083% nebulizer solution Take 3 mLs  (2.5 mg total) by nebulization every 6 (six) hours as needed for wheezing or shortness of breath. 06/13/19   Wieters, Hallie C, PA-C  albuterol (VENTOLIN HFA) 108 (90 Base) MCG/ACT inhaler Inhale 1-2 puffs into the lungs every 6 (six) hours as needed for wheezing or shortness of breath. 06/13/19   Wieters, Hallie C, PA-C  ARIPiprazole (ABILIFY) 5 MG tablet Take 1 tablet (5 mg total) by mouth at bedtime. For mood 08/24/18   Aldean Baker, NP  famotidine (PEPCID) 20 MG tablet Take 1 tablet (20 mg total) by mouth 2 (two) times daily. 10/01/19   Darr, Veryl Speak, PA-C  Insulin Pen Needle (INSUPEN PEN NEEDLES) 32G X 4 MM MISC For use with Saxenda 06/12/19   Dessa Phi, MD  Liraglutide -Weight Management (SAXENDA) 18 MG/3ML SOPN Inject 1.8 mg of oxycodone into the skin daily. 06/12/19   Dessa Phi, MD  meclizine (ANTIVERT) 50 MG tablet Take 1 tablet (50 mg total) by mouth 3 (three) times daily as needed. 10/01/19   Darr, Veryl Speak, PA-C  norethindrone-ethinyl estradiol-iron (MICROGESTIN FE 1.5/30) 1.5-30 MG-MCG tablet Take 1 tablet by mouth daily. Skip placebo pills x 2 months. On the third month- take the placebo (brown) pills. 06/12/19   Dessa Phi, MD  ondansetron (ZOFRAN) 4 MG tablet Take 1 tablet (4 mg total) by mouth every 8 (eight) hours as  needed for nausea or vomiting. 10/01/19   Darr, Veryl Speak, PA-C  predniSONE (STERAPRED UNI-PAK 21 TAB) 10 MG (21) TBPK tablet Take by mouth daily. Take as directed on package 06/13/19   Wieters, Hallie C, PA-C  traZODone (DESYREL) 50 MG tablet Take 50-100 mg by mouth at bedtime as needed for sleep.    [provider]  citalopram (CELEXA) 10 MG tablet Take 1 tablet (10 mg total) by mouth daily. For mood 08/25/18 03/05/19  Aldean Baker, NP  zolpidem (AMBIEN) 5 MG tablet Take 1 tablet (5 mg total) by mouth at bedtime as needed for sleep. 08/24/18 03/05/19  Aldean Baker, NP    Family History Family History  Problem Relation Age of Onset  . Lupus Mother     . Heart disease Father     Social History Social History   Tobacco Use  . Smoking status: Current Every Day Smoker    Packs/day: 0.20  . Smokeless tobacco: Never Used  . Tobacco comment:  5 perday  Substance Use Topics  . Alcohol use: No  . Drug use: No     Allergies   Patient has no known allergies.   Review of Systems Review of Systems  See HPI  Physical Exam Triage Vital Signs ED Triage Vitals [11/05/19 1758]  Enc Vitals Group     BP 110/66     Pulse Rate 97     Resp 18     Temp 98.7 F (37.1 C)     Temp Source Oral     SpO2 96 %     Weight      Height      Head Circumference      Peak Flow      Pain Score 6     Pain Loc      Pain Edu?      Excl. in GC?    No data found.  Updated Vital Signs BP 110/66 (BP Location: Right Arm)   Pulse 97   Temp 98.7 F (37.1 C) (Oral)   Resp 18   SpO2 96%   Visual Acuity Right Eye Distance:   Left Eye Distance:   Bilateral Distance:    Right Eye Near:   Left Eye Near:    Bilateral Near:     Physical Exam Gen: NAD, alert, cooperative with exam,  CV:  no edema, +2 pedal pulses   Resp: no accessory muscle use, non-labored,  GI: no masses,, soft, nondistended, tenderness palpation the right lower quadrant as well as the left lower quadrant, no hernia  Skin: no rashes, no areas of induration  Neuro: normal tone, normal sensation to touch    UC Treatments / Results  Labs (all labs ordered are listed, but only abnormal results are displayed) Labs Reviewed  POCT URINALYSIS DIP (DEVICE) - Abnormal; Notable for the following components:      Result Value   Leukocytes,Ua TRACE (*)    All other components within normal limits  URINE CULTURE  POC URINE PREG, ED    EKG   Radiology No results found.  Procedures Procedures (including critical care time)  Medications Ordered in UC Medications - No data to display  Initial Impression / Assessment and Plan / UC Course  I have reviewed the triage  vital signs and the nursing notes.  Pertinent labs & imaging results that were available during my care of the patient were reviewed by me and considered in my medical decision making (see chart for  details).     Diane Mann is a 19 year old female is presenting with lower quadrant abdominal pain.  Pregnancy test was negative and urinalysis showed trace leukocytes.  Urine culture was sent.  Concern for appendicitis with worsening lower quadrant symptoms.  Advised to be seen in the emergency setting in order for the consideration of a CT scan.  Counseled on supportive care.  Given occasions to follow-up return.  Final Clinical Impressions(s) / UC Diagnoses   Final diagnoses:  Right lower quadrant abdominal pain     Discharge Instructions     Please be seen more emergently as you may have appendicitis.     ED Prescriptions    None     PDMP not reviewed this encounter.   Rosemarie Ax, MD 11/05/19 2102

## 2019-11-05 NOTE — ED Triage Notes (Signed)
Pt presents with generalized abdominal pain since yesterday. °

## 2019-11-05 NOTE — Discharge Instructions (Signed)
Please be seen more emergently as you may have appendicitis.

## 2019-11-06 ENCOUNTER — Emergency Department (HOSPITAL_COMMUNITY)
Admission: EM | Admit: 2019-11-06 | Discharge: 2019-11-06 | Disposition: A | Discharge status: Home / Self Care | Payer: Medicaid Other | Attending: Emergency Medicine | Admitting: Emergency Medicine

## 2019-11-06 ENCOUNTER — Other Ambulatory Visit: Payer: Self-pay

## 2019-11-06 ENCOUNTER — Encounter (HOSPITAL_COMMUNITY): Payer: Self-pay | Admitting: Emergency Medicine

## 2019-11-06 ENCOUNTER — Emergency Department (HOSPITAL_COMMUNITY): Payer: Medicaid Other

## 2019-11-06 DIAGNOSIS — Z794 Long term (current) use of insulin: Secondary | ICD-10-CM | POA: Diagnosis not present

## 2019-11-06 DIAGNOSIS — Z68.41 Body mass index (BMI) pediatric, greater than or equal to 95th percentile for age: Secondary | ICD-10-CM | POA: Insufficient documentation

## 2019-11-06 DIAGNOSIS — F17210 Nicotine dependence, cigarettes, uncomplicated: Secondary | ICD-10-CM | POA: Diagnosis not present

## 2019-11-06 DIAGNOSIS — Z79899 Other long term (current) drug therapy: Secondary | ICD-10-CM | POA: Diagnosis not present

## 2019-11-06 DIAGNOSIS — R110 Nausea: Secondary | ICD-10-CM | POA: Diagnosis not present

## 2019-11-06 DIAGNOSIS — R1030 Lower abdominal pain, unspecified: Secondary | ICD-10-CM | POA: Diagnosis not present

## 2019-11-06 DIAGNOSIS — J45909 Unspecified asthma, uncomplicated: Secondary | ICD-10-CM | POA: Diagnosis not present

## 2019-11-06 LAB — COMPREHENSIVE METABOLIC PANEL
ALT: 26 U/L (ref 0–44)
AST: 35 U/L (ref 15–41)
Albumin: 4.9 g/dL (ref 3.5–5.0)
Alkaline Phosphatase: 41 U/L (ref 38–126)
Anion gap: 15 (ref 5–15)
BUN: 15 mg/dL (ref 6–20)
CO2: 27 mmol/L (ref 22–32)
Calcium: 10.2 mg/dL (ref 8.9–10.3)
Chloride: 105 mmol/L (ref 98–111)
Creatinine, Ser: 1 mg/dL (ref 0.44–1.00)
GFR calc Af Amer: >60 mL/min (ref >60)
GFR calc non Af Amer: >60 mL/min (ref >60)
Glucose, Bld: 86 mg/dL (ref 70–99)
Potassium: 3.7 mmol/L (ref 3.5–5.1)
Sodium: 147 mmol/L — ABNORMAL HIGH (ref 135–145)
Total Bilirubin: 0.4 mg/dL (ref 0.3–1.2)
Total Protein: 8.7 g/dL — ABNORMAL HIGH (ref 6.5–8.1)

## 2019-11-06 LAB — URINALYSIS, ROUTINE W REFLEX MICROSCOPIC
Bilirubin Urine: NEGATIVE
Glucose, UA: NEGATIVE mg/dL
Hgb urine dipstick: NEGATIVE
Ketones, ur: 5 mg/dL — AB
Leukocytes,Ua: NEGATIVE
Nitrite: NEGATIVE
Protein, ur: NEGATIVE mg/dL
Specific Gravity, Urine: 1.025 (ref 1.005–1.030)
pH: 5 (ref 5.0–8.0)

## 2019-11-06 LAB — URINE CULTURE

## 2019-11-06 LAB — CBC
HCT: 33.4 % — ABNORMAL LOW (ref 36.0–46.0)
Hemoglobin: 10.9 g/dL — ABNORMAL LOW (ref 12.0–15.0)
MCH: 32.5 pg (ref 26.0–34.0)
MCHC: 32.6 g/dL (ref 30.0–36.0)
MCV: 99.7 fL (ref 80.0–100.0)
Platelets: 253 10*3/uL (ref 150–400)
RBC: 3.35 MIL/uL — ABNORMAL LOW (ref 3.87–5.11)
RDW: 12.9 % (ref 11.5–15.5)
WBC: 9.7 10*3/uL (ref 4.0–10.5)
nRBC: 0 % (ref 0.0–0.2)

## 2019-11-06 LAB — LIPASE, BLOOD: Lipase: 49 U/L (ref 11–51)

## 2019-11-06 LAB — I-STAT BETA HCG BLOOD, ED (MC, WL, AP ONLY): I-stat hCG, quantitative: <5 m[IU]/mL (ref <5)

## 2019-11-06 LAB — POC OCCULT BLOOD, ED: Fecal Occult Bld: NEGATIVE

## 2019-11-06 MED ORDER — DICYCLOMINE HCL 20 MG PO TABS
20.0000 mg | ORAL_TABLET | Freq: Two times a day (BID) | ORAL | 0 refills | Status: DC
Start: 2019-11-06 — End: 2021-05-01

## 2019-11-06 MED ORDER — IOHEXOL 300 MG/ML  SOLN
100.0000 mL | Freq: Once | INTRAMUSCULAR | Status: AC | PRN
  Administered 2019-11-06: 100 mL via INTRAVENOUS

## 2019-11-06 MED ORDER — SODIUM CHLORIDE 0.9% FLUSH: 3.0000 mL | Freq: Once | INTRAVENOUS | Status: DC

## 2019-11-06 MED ORDER — ONDANSETRON 4 MG PO TBDP
4.0000 mg | ORAL_TABLET | Freq: Three times a day (TID) | ORAL | 0 refills | Status: DC | PRN
Start: 2019-11-06 — End: 2020-05-26

## 2019-11-06 MED ORDER — ONDANSETRON HCL 4 MG/2ML IJ SOLN
4.0000 mg | Freq: Once | INTRAMUSCULAR | Status: AC
  Administered 2019-11-06: 4 mg via INTRAVENOUS
  Filled 2019-11-06: qty 2

## 2019-11-06 MED ORDER — MORPHINE SULFATE (PF) 4 MG/ML IV SOLN
4.0000 mg | Freq: Once | INTRAVENOUS | Status: AC
  Administered 2019-11-06: 4 mg via INTRAVENOUS
  Filled 2019-11-06: qty 1

## 2019-11-06 MED ORDER — SODIUM CHLORIDE 0.9 % IV BOLUS
1000.0000 mL | Freq: Once | INTRAVENOUS | Status: AC
  Administered 2019-11-06: 1000 mL via INTRAVENOUS

## 2019-11-06 NOTE — ED Provider Notes (Addendum)
[facility] Provider Note   CSN: [id number] Arrival date & time: 11/06/19  1706     History Chief Complaint  Patient presents with   Abdominal Pain   Nausea    Diane Mann is a 19 y.o. female.  HPI      Diane Mann is a 19 y.o. female, with a history of RBBB, obesity, asthma, anxiety, presenting to the ED with abdominal pain for the last 3 days.  Patient states her pain is in the lower abdomen, right lower quadrant and left lower quadrant.  It has been rather constant for the last 3 days, however, she notes she has had pain in these areas intermittently for several months.  Accompanied by nausea.  Bloody stools for the last several months.  She also informs me she has not had a menstrual cycle for the last couple years.  She was taking OCPs, however, has not been taking them for the last several months.  She has not been sexually active since last fall.  Patient was seen at an urgent care yesterday and advised to come to the ED for CT scan due to concern for possible appendicitis.  Denies fever/chills, vomiting, urinary symptoms, flank/back pain, abnormal vaginal discharge, any vaginal bleeding, melena, epigastric pain, shortness of breath, or any other complaints.  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   Congenital heart problem    blockage   Headache    Obesity    Panic attacks 04/24/2015   RBBB (right bundle branch block) 04/24/2015   Social anxiety disorder 04/24/2015    Patient Active Problem List   Diagnosis Date Noted   Major depressive disorder, recurrent episode, severe (HCC) 08/19/2018   Morbid obesity with body mass index (BMI) greater than 99th percentile for age in childhood (HCC) 05/19/2016   Social anxiety disorder 04/24/2015   Panic attacks 04/24/2015   RBBB (right bundle branch block) 04/24/2015   AV block, 1st  degree 04/24/2015   Asthma 04/24/2015   Oligomenorrhea 04/09/2015   Thyroid function test abnormal 08/05/2014   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     Family History  Problem Relation Age of Onset   Lupus Mother    Heart disease Father     Social History   Tobacco Use   Smoking status: Current Every Day Smoker    Packs/day: 0.20   Smokeless tobacco: Never Used   Tobacco comment:  5 perday  Substance Use Topics   Alcohol use: No   Drug use: No    Home Medications Prior to Admission medications   Medication Sig Start Date End Date Taking? Authorizing Provider  acetaminophen (TYLENOL) 325 MG tablet Take 2 tablets (650 mg total) by mouth every 6 (six) hours as needed. Patient taking differently: Take 650 mg by mouth every 6 (six) hours as needed for headache.  10/01/19  Yes Darr, Veryl Speak, PA-C  albuterol (PROVENTIL) (2.5 MG/3ML) 0.083% nebulizer solution Take 3 mLs (2.5 mg total) by nebulization every 6 (six) hours as needed for wheezing or shortness of breath. 06/13/19  Yes Wieters, Hallie C, PA-C  albuterol (VENTOLIN HFA) 108 (90  Base) MCG/ACT inhaler Inhale 1-2 puffs into the lungs every 6 (six) hours as needed for wheezing or shortness of breath. 06/13/19  Yes Wieters, Hallie C, PA-C  ARIPiprazole (ABILIFY) 10 MG tablet Take 10 mg by mouth daily. 10/31/19  Yes [provider]  hydrOXYzine (ATARAX/VISTARIL) 25 MG tablet Take 25 mg by mouth at bedtime.  10/31/19  Yes [provider]  norethindrone-ethinyl estradiol-iron (MICROGESTIN FE 1.5/30) 1.5-30 MG-MCG tablet Take 1 tablet by mouth daily. Skip placebo pills x 2 months. On the third month- take the placebo (brown) pills. 06/12/19  Yes Dessa PhiBadik, Jennifer, MD  ARIPiprazole (ABILIFY) 5 MG tablet  Take 1 tablet (5 mg total) by mouth at bedtime. For mood Patient not taking: Reported on 11/06/2019 08/24/18   Aldean BakerSykes, Janet E, NP  dicyclomine (BENTYL) 20 MG tablet Take 1 tablet (20 mg total) by mouth 2 (two) times daily. 11/06/19   Destenee Guerry C, PA-C  famotidine (PEPCID) 20 MG tablet Take 1 tablet (20 mg total) by mouth 2 (two) times daily. Patient not taking: Reported on 11/06/2019 10/01/19   Darr, Veryl SpeakJacob E, PA-C  Insulin Pen Needle (INSUPEN PEN NEEDLES) 32G X 4 MM MISC For use with Saxenda 06/12/19   Dessa PhiBadik, Jennifer, MD  Liraglutide -Weight Management (SAXENDA) 18 MG/3ML SOPN Inject 1.8 mg of oxycodone into the skin daily. Patient not taking: Reported on 11/06/2019 06/12/19   Dessa PhiBadik, Jennifer, MD  meclizine (ANTIVERT) 50 MG tablet Take 1 tablet (50 mg total) by mouth 3 (three) times daily as needed. Patient not taking: Reported on 11/06/2019 10/01/19   Darr, Veryl SpeakJacob E, PA-C  ondansetron (ZOFRAN ODT) 4 MG disintegrating tablet Take 1 tablet (4 mg total) by mouth every 8 (eight) hours as needed for nausea or vomiting. 11/06/19   Theresa Dohrman C, PA-C  ondansetron (ZOFRAN) 4 MG tablet Take 1 tablet (4 mg total) by mouth every 8 (eight) hours as needed for nausea or vomiting. Patient not taking: Reported on 11/06/2019 10/01/19   Darr, Veryl SpeakJacob E, PA-C  predniSONE (STERAPRED UNI-PAK 21 TAB) 10 MG (21) TBPK tablet Take by mouth daily. Take as directed on package Patient not taking: Reported on 11/06/2019 06/13/19   Wieters, Hallie C, PA-C  traZODone (DESYREL) 50 MG tablet Take 50-100 mg by mouth at bedtime as needed for sleep.    [provider]  citalopram (CELEXA) 10 MG tablet Take 1 tablet (10 mg total) by mouth daily. For mood 08/25/18 03/05/19  Aldean BakerSykes, Janet E, NP  zolpidem (AMBIEN) 5 MG tablet Take 1 tablet (5 mg total) by mouth at bedtime as needed for sleep. 08/24/18 03/05/19  Aldean BakerSykes, Janet E, NP    Allergies    Patient has no known allergies.  Review of Systems   Review of Systems  Constitutional: Negative  for chills, diaphoresis and fever.  Respiratory: Negative for shortness of breath.   Cardiovascular: Negative for chest pain.  Gastrointestinal: Positive for abdominal pain, blood in stool and nausea. Negative for vomiting.  Genitourinary: Negative for difficulty urinating, dysuria, flank pain, hematuria, vaginal bleeding and vaginal discharge.  Musculoskeletal: Negative for back pain.  Neurological: Negative for syncope and weakness.  All other systems reviewed and are negative.   Physical Exam Updated Vital Signs BP 130/73 (BP Location: Left Arm)    Pulse 86    Temp 99 F (37.2 C) (Oral)    Resp 18    Ht 5\' 1"  (1.549 m)    Wt 95.3 kg    SpO2 96%    BMI  39.68 kg/m   Physical Exam Vitals and nursing note reviewed.  Constitutional:      General: She is not in acute distress.    Appearance: She is well-developed. She is not diaphoretic.  HENT:     Head: Normocephalic and atraumatic.     Mouth/Throat:     Mouth: Mucous membranes are moist.     Pharynx: Oropharynx is clear.  Eyes:     Conjunctiva/sclera: Conjunctivae normal.  Cardiovascular:     Rate and Rhythm: Normal rate and regular rhythm.     Pulses: Normal pulses.          Radial pulses are 2+ on the right side and 2+ on the left side.     Heart sounds: Normal heart sounds.  Pulmonary:     Effort: Pulmonary effort is normal. No respiratory distress.     Breath sounds: Normal breath sounds.  Abdominal:     Palpations: Abdomen is soft.     Tenderness: There is abdominal tenderness. There is no guarding.       Comments: Patient verbally indicates tenderness during exam, but has no other reaction.  Musculoskeletal:     Cervical back: Neck supple.  Lymphadenopathy:     Cervical: No cervical adenopathy.  Skin:    General: Skin is warm and dry.  Neurological:     Mental Status: She is alert.  Psychiatric:        Mood and Affect: Mood and affect normal.        Speech: Speech normal.        Behavior: Behavior normal.      ED Results / Procedures / Treatments   Labs (all labs ordered are listed, but only abnormal results are displayed) Labs Reviewed  COMPREHENSIVE METABOLIC PANEL - Abnormal; Notable for the following components:      Result Value   Sodium 147 (*)    Total Protein 8.7 (*)    All other components within normal limits  URINALYSIS, ROUTINE W REFLEX MICROSCOPIC - Abnormal; Notable for the following components:   APPearance HAZY (*)    Ketones, ur 5 (*)    All other components within normal limits  CBC - Abnormal; Notable for the following components:   RBC 3.35 (*)    Hemoglobin 10.9 (*)    HCT 33.4 (*)    All other components within normal limits  LIPASE, BLOOD  I-STAT BETA HCG BLOOD, ED ([facility], [facility], [facility] ONLY)  POC OCCULT BLOOD, ED    Hemoglobin  Date Value Ref Range Status  11/06/2019 10.9 (L) 12.0 - 15.0 g/dL Final  45/08/8880 80.0 (L) 12.0 - 15.0 g/dL Final  34/91/7915 05.6 (L) 12.0 - 15.0 g/dL Final  97/94/8016 55.3 (L) 12.0 - 15.0 g/dL Final    EKG None  Radiology CT ABDOMEN PELVIS W CONTRAST  Result Date: 11/06/2019 CLINICAL DATA:  Right lower quadrant abdominal pain for 2 days EXAM: CT ABDOMEN AND PELVIS WITH CONTRAST TECHNIQUE: Multidetector CT imaging of the abdomen and pelvis was performed using the standard protocol following bolus administration of intravenous contrast. CONTRAST:  OMNIPAQUE IOHEXOL 300 MG/ML  SOLN COMPARISON:  None. FINDINGS: Lower chest: No acute pleural or parenchymal lung disease. Hepatobiliary: No focal liver abnormality is seen. No gallstones, gallbladder wall thickening, or biliary dilatation. Pancreas: Unremarkable. No pancreatic ductal dilatation or surrounding inflammatory changes. Spleen: Normal in size without focal abnormality. Adrenals/Urinary Tract: Adrenal glands are unremarkable. Kidneys are normal, without renal calculi, focal lesion, or hydronephrosis. Bladder is unremarkable. Stomach/Bowel:  No bowel obstruction or ileus. Normal  appendix right lower quadrant. No bowel wall thickening or inflammatory changes. Vascular/Lymphatic: No significant vascular findings are present. No enlarged abdominal or pelvic lymph nodes. Reproductive: Uterus and bilateral adnexa are unremarkable. Other: No abdominal wall hernia or abnormality. No abdominopelvic ascites. Musculoskeletal: No acute or destructive bony lesions. Reconstructed images demonstrate no additional findings. IMPRESSION: 1. No acute intra-abdominal or intrapelvic process. Normal appendix. Electronically Signed   By: Sharlet Salina M.D.   On: 11/06/2019 21:37    Procedures Procedures (including critical care time)  Medications Ordered in ED Medications  sodium chloride flush (NS) 0.9 % injection 3 mL (0 mLs Intravenous Hold 11/06/19 2314)  sodium chloride 0.9 % bolus 1,000 mL (0 mLs Intravenous Stopped 11/06/19 2318)  ondansetron (ZOFRAN) injection 4 mg (4 mg Intravenous Given 11/06/19 2046)  morphine 4 MG/ML injection 4 mg (4 mg Intravenous Given 11/06/19 2047)  iohexol (OMNIPAQUE) 300 MG/ML solution 100 mL (100 mLs Intravenous Contrast Given 11/06/19 2122)    ED Course  I have reviewed the triage vital signs and the nursing notes.  Pertinent labs & imaging results that were available during my care of the patient were reviewed by me and considered in my medical decision making (see chart for details).  Clinical Course as of Nov 05 2333  Tue Nov 06, 2019  2129 NS bolus was ordered and completed prior to this result.  Sodium(!): 147 [SJ]    Clinical Course User Index [SJ] Juliette Standre, Hillard Danker, PA-C   MDM Rules/Calculators/A&P                      Patient presents with lower abdominal pain.  She states she has had the same pain recurrent over several months. Patient is nontoxic appearing, afebrile, not tachycardic, not tachypneic, not hypotensive, maintains excellent SPO2 on room air, and is in no apparent distress.   I have reviewed the patient's chart to obtain more  information.   I reviewed and interpreted the patient's labs and radiological studies. No leukocytosis.  Hemoglobin stable.  Hemoccult negative. Very mildly elevated sodium.  This was discussed with the patient as well as the recommendation for hydration at home and retesting by PCP. CT without acute abnormality.  Offered pelvic exam with explanation.  Patient declined.  States she will follow-up with OB/GYN.  I explicitly discussed that there could be pelvic pathology, such as PID, that would not be able to be diagnosed without a pelvic exam.  Patient acknowledged this, but would still like to do any further evaluation of this type with OB/GYN.  The patient was given instructions for home care as well as return precautions. Patient voices understanding of these instructions, accepts the plan, and is comfortable with discharge.   Findings and plan of care discussed with Kennis Carina, MD.    Vitals:   11/06/19 2100 11/06/19 2155 11/06/19 2300 11/06/19 2318  BP:  123/60 (!) 108/52   Pulse:  80 83   Resp: 17 18 17    Temp:    98 F (36.7 C)  TempSrc:    Oral  SpO2:  100% 93%   Weight:      Height:         Final Clinical Impression(s) / ED Diagnoses Final diagnoses:  Lower abdominal pain  Nausea    Rx / DC Orders ED Discharge Orders         Ordered    dicyclomine (BENTYL) 20 MG tablet  2 times  daily     11/06/19 2307    ondansetron (ZOFRAN ODT) 4 MG disintegrating tablet  Every 8 hours PRN     11/06/19 2307           Lorayne Bender, PA-C 11/06/19 2335    Lorayne Bender, PA-C 11/06/19 2336    Maudie Flakes, MD 11/08/19 [phone]

## 2019-11-06 NOTE — Discharge Instructions (Addendum)
   Nausea, Vomiting, and Diarrhea  Hand washing: Wash your hands throughout the day, but especially before and after touching the face, using the restroom, sneezing, coughing, or touching surfaces that have been coughed or sneezed upon. Hydration: Symptoms will be intensified and complicated by dehydration. Dehydration can also extend the duration of symptoms. Drink plenty of fluids and get plenty of rest. You should be drinking at least half a liter of water an hour to stay hydrated. Electrolyte drinks (ex. Gatorade, Powerade, Pedialyte) are also encouraged. You should be drinking enough fluids to make your urine light yellow, almost clear. If this is not the case, you are not drinking enough water. Please note that some of the treatments indicated below will not be effective if you are not adequately hydrated. Diet: Please concentrate on hydration, however, you may introduce food slowly.  Start with a clear liquid diet, progressed to a full liquid diet, and then bland solids as you are able. Pain or fever: Ibuprofen, Naproxen, or Tylenol for pain or fever.  Nausea/vomiting: Use the ondansetron (generic for Zofran) for nausea or vomiting.  This medication may not prevent all vomiting or nausea, but can help facilitate better hydration. Things that can help with nausea/vomiting also include peppermint/menthol candies, vitamin B12, and ginger. Diarrhea: May use medications such as loperamide (Imodium) or Bismuth subsalicylate (Pepto-Bismol). Bentyl: This medication is what is known as an antispasmodic and is intended to help reduce abdominal discomfort. Follow-up: Follow-up with a gastroenterologist on this matter. Return: Return should you develop a fever, bloody diarrhea, increased abdominal pain, uncontrolled vomiting, or any other major concerns.  For prescription assistance, may try using prescription discount sites or apps, such as goodrx.com  For your lower abdominal pain, recommend follow-up  with OB/GYN.  Call to make an appointment.

## 2019-11-06 NOTE — ED Triage Notes (Signed)
Patient here from home reporting right lower abd pain that started Sunday night. Seen at urgent care, recommended CT scan.

## 2020-03-02 ENCOUNTER — Other Ambulatory Visit: Payer: Self-pay

## 2020-03-02 ENCOUNTER — Ambulatory Visit (HOSPITAL_COMMUNITY)
Admission: EM | Admit: 2020-03-02 | Discharge: 2020-03-02 | Disposition: A | Discharge status: Home / Self Care | Payer: Medicaid Other | Attending: Family Medicine | Admitting: Family Medicine

## 2020-03-02 ENCOUNTER — Ambulatory Visit (HOSPITAL_COMMUNITY): Payer: Medicaid Other

## 2020-03-02 ENCOUNTER — Encounter (HOSPITAL_COMMUNITY): Payer: Self-pay

## 2020-03-02 DIAGNOSIS — F17200 Nicotine dependence, unspecified, uncomplicated: Secondary | ICD-10-CM | POA: Diagnosis not present

## 2020-03-02 DIAGNOSIS — F332 Major depressive disorder, recurrent severe without psychotic features: Secondary | ICD-10-CM | POA: Insufficient documentation

## 2020-03-02 DIAGNOSIS — J069 Acute upper respiratory infection, unspecified: Secondary | ICD-10-CM | POA: Diagnosis present

## 2020-03-02 DIAGNOSIS — Z793 Long term (current) use of hormonal contraceptives: Secondary | ICD-10-CM | POA: Diagnosis not present

## 2020-03-02 DIAGNOSIS — R112 Nausea with vomiting, unspecified: Secondary | ICD-10-CM | POA: Diagnosis present

## 2020-03-02 DIAGNOSIS — Z79899 Other long term (current) drug therapy: Secondary | ICD-10-CM | POA: Diagnosis not present

## 2020-03-02 DIAGNOSIS — F419 Anxiety disorder, unspecified: Secondary | ICD-10-CM | POA: Diagnosis not present

## 2020-03-02 DIAGNOSIS — Z6841 Body Mass Index (BMI) 40.0 and over, adult: Secondary | ICD-10-CM | POA: Diagnosis not present

## 2020-03-02 DIAGNOSIS — J4521 Mild intermittent asthma with (acute) exacerbation: Secondary | ICD-10-CM | POA: Diagnosis present

## 2020-03-02 DIAGNOSIS — Z20822 Contact with and (suspected) exposure to covid-19: Secondary | ICD-10-CM | POA: Insufficient documentation

## 2020-03-02 DIAGNOSIS — Z794 Long term (current) use of insulin: Secondary | ICD-10-CM | POA: Insufficient documentation

## 2020-03-02 LAB — SARS CORONAVIRUS 2 (TAT 6-24 HRS): SARS Coronavirus 2: NEGATIVE

## 2020-03-02 MED ORDER — ONDANSETRON 4 MG PO TBDP
ORAL_TABLET | ORAL | Status: AC
  Filled 2020-03-02: qty 1

## 2020-03-02 MED ORDER — PREDNISONE 20 MG PO TABS
ORAL_TABLET | ORAL | 0 refills | Status: DC
Start: 2020-03-02 — End: 2020-05-26

## 2020-03-02 MED ORDER — ONDANSETRON HCL 4 MG PO TABS: 4.0000 mg | ORAL_TABLET | Freq: Three times a day (TID) | ORAL | 0 refills | Status: DC | PRN

## 2020-03-02 MED ORDER — ONDANSETRON 4 MG PO TBDP
4.0000 mg | ORAL_TABLET | Freq: Once | ORAL | Status: AC
  Administered 2020-03-02: 4 mg via ORAL

## 2020-03-02 NOTE — ED Provider Notes (Signed)
[facility]    CSN: [id number] Arrival date & time: 03/02/20  1016      History   Chief Complaint Chief Complaint  Patient presents with   Shortness of Breath   Cough    HPI Diane Mann is a 19 y.o. female.   Diane Mann presents with complaints of chest pain, headache, fever, vomiting, chills, fatigue, nausea, shortness of breath, loss of taste and smell, cough, runny nose, sore throat. No ear pain. No diarrhea. Symptoms started this morning. Yesterday had mild cough and chest pain. All other symptoms started this morning. No known fever specifically. Asthma, used inhaler which hasn't helped. No blood or black in emesis. No known ill contacts. Works in [employer]. No history of covid-19 and has not received vaccination.    ROS per HPI, negative if not otherwise mentioned.      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   Congenital heart problem    blockage   Headache    Obesity    Panic attacks 04/24/2015   RBBB (right bundle branch block) 04/24/2015   Social anxiety disorder 04/24/2015    Patient Active Problem List   Diagnosis Date Noted   Major depressive disorder, recurrent episode, severe (HCC) 08/19/2018   Morbid obesity with body mass index (BMI) greater than 99th percentile for age in childhood (HCC) 05/19/2016   Social anxiety disorder 04/24/2015   Panic attacks 04/24/2015   RBBB (right bundle branch block) 04/24/2015   AV block, 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      Home Medications    Prior to  Admission medications   Medication Sig Start Date End Date Taking? Authorizing Provider  albuterol (VENTOLIN HFA) 108 (90 Base) MCG/ACT inhaler Inhale 1-2 puffs into the lungs every 6 (six) hours as needed for wheezing or shortness of breath. 06/13/19  Yes Wieters, Hallie C, PA-C  ARIPiprazole (ABILIFY) 10 MG tablet Take 10 mg by mouth daily. 10/31/19  Yes [provider]  hydrOXYzine (ATARAX/VISTARIL) 25 MG tablet Take 25 mg by mouth at bedtime.  10/31/19  Yes [provider]  norethindrone-ethinyl estradiol-iron (MICROGESTIN FE 1.5/30) 1.5-30 MG-MCG tablet Take 1 tablet by mouth daily. Skip placebo pills x 2 months. On the third month- take the placebo (brown) pills. 06/12/19  Yes Dessa Phi, MD  acetaminophen (TYLENOL) 325 MG tablet Take 2 tablets (650 mg total) by mouth every 6 (six) hours as needed. Patient taking differently: Take 650 mg by mouth every 6 (six) hours as needed for headache.  10/01/19   Darr, Veryl Speak, PA-C  albuterol (PROVENTIL) (2.5 MG/3ML) 0.083% nebulizer solution Take 3 mLs (2.5 mg total) by nebulization every 6 (six) hours as needed for wheezing or shortness of breath. 06/13/19   Wieters, Hallie C, PA-C  ARIPiprazole (ABILIFY) 5 MG tablet Take 1 tablet (5 mg total) by mouth at bedtime. For mood Patient not taking: Reported on 11/06/2019 08/24/18   Aldean Baker, NP  dicyclomine (BENTYL) 20 MG tablet Take 1 tablet (20 mg total)  by mouth 2 (two) times daily. 11/06/19   Joy, Shawn C, PA-C  famotidine (PEPCID) 20 MG tablet Take 1 tablet (20 mg total) by mouth 2 (two) times daily. Patient not taking: Reported on 11/06/2019 10/01/19   Darr, Veryl Speak, PA-C  Insulin Pen Needle (INSUPEN PEN NEEDLES) 32G X 4 MM MISC For use with Saxenda 06/12/19   Dessa Phi, MD  Liraglutide -Weight Management (SAXENDA) 18 MG/3ML SOPN Inject 1.8 mg of oxycodone into the skin daily. Patient not taking: Reported on 11/06/2019 06/12/19   Dessa Phi, MD  meclizine (ANTIVERT) 50 MG  tablet Take 1 tablet (50 mg total) by mouth 3 (three) times daily as needed. Patient not taking: Reported on 11/06/2019 10/01/19   Darr, Veryl Speak, PA-C  ondansetron (ZOFRAN ODT) 4 MG disintegrating tablet Take 1 tablet (4 mg total) by mouth every 8 (eight) hours as needed for nausea or vomiting. 11/06/19   Joy, Shawn C, PA-C  ondansetron (ZOFRAN) 4 MG tablet Take 1 tablet (4 mg total) by mouth every 8 (eight) hours as needed for nausea or vomiting. 03/02/20   Linus Mako B, NP  predniSONE (DELTASONE) 20 MG tablet 3-3-3-2-2-2-1-1-1 03/02/20   Linus Mako B, NP  traZODone (DESYREL) 50 MG tablet Take 50-100 mg by mouth at bedtime as needed for sleep.    [provider]  citalopram (CELEXA) 10 MG tablet Take 1 tablet (10 mg total) by mouth daily. For mood 08/25/18 03/05/19  Aldean Baker, NP  zolpidem (AMBIEN) 5 MG tablet Take 1 tablet (5 mg total) by mouth at bedtime as needed for sleep. 08/24/18 03/05/19  Aldean Baker, NP    Family History Family History  Problem Relation Age of Onset   Lupus Mother    Heart disease Father     Social History Social History   Tobacco Use   Smoking status: Current Every Day Smoker    Packs/day: 0.20   Smokeless tobacco: Never Used   Tobacco comment:  5 perday  Vaping Use   Vaping Use: Never used  Substance Use Topics   Alcohol use: No   Drug use: No     Allergies   Patient has no known allergies.   Review of Systems Review of Systems   Physical Exam Triage Vital Signs ED Triage Vitals  Enc Vitals Group     BP 03/02/20 1118 124/75     Pulse Rate 03/02/20 1118 (!) 108     Resp 03/02/20 1118 (!) 24     Temp 03/02/20 1118 100 F (37.8 C)     Temp Source 03/02/20 1118 Oral     SpO2 03/02/20 1118 94 %     Weight --      Height --      Head Circumference --      Peak Flow --      Pain Score 03/02/20 1115 10     Pain Loc --      Pain Edu? --      Excl. in GC? --    No data found.  Updated Vital Signs BP 124/75 (BP  Location: Left Arm)    Pulse (!) 108    Temp 100 F (37.8 C) (Oral)    Resp (!) 24    LMP 01/27/2020    SpO2 94%   Visual Acuity Right Eye Distance:   Left Eye Distance:   Bilateral Distance:    Right Eye Near:   Left Eye Near:    Bilateral Near:  Physical Exam Constitutional:      General: She is not in acute distress.    Appearance: She is well-developed.  HENT:     Head: Normocephalic.  Cardiovascular:     Rate and Rhythm: Normal rate and regular rhythm.  Pulmonary:     Effort: Pulmonary effort is normal. No tachypnea.     Breath sounds: Wheezing present.  Abdominal:     Tenderness: There is no abdominal tenderness.     Comments: zofran in clinic prior to exam has improved nausea per patient   Skin:    General: Skin is warm and dry.  Neurological:     Mental Status: She is alert and oriented to person, place, and time.      UC Treatments / Results  Labs (all labs ordered are listed, but only abnormal results are displayed) Labs Reviewed  SARS CORONAVIRUS 2 (TAT 6-24 HRS)    EKG   Radiology No results found.  Procedures Procedures (including critical care time)  Medications Ordered in UC Medications  ondansetron (ZOFRAN-ODT) disintegrating tablet 4 mg (4 mg Oral Given 03/02/20 1123)    Initial Impression / Assessment and Plan / UC Course  I have reviewed the triage vital signs and the nursing notes.  Pertinent labs & imaging results that were available during my care of the patient were reviewed by me and considered in my medical decision making (see chart for details).     Non toxic. No work of breathing noted in exam. Asthma with wheezing. Prednisone course provided. zofran for prn use. History and physical consistent with viral illness.  Covid testing pending and isolation instructions provided.  Return precautions provided. Patient verbalized understanding and agreeable to plan.   Final Clinical Impressions(s) / UC Diagnoses   Final diagnoses:    Upper respiratory tract infection, unspecified type  Mild intermittent asthma with exacerbation  Non-intractable vomiting with nausea, unspecified vomiting type     Discharge Instructions     Small frequent sips of fluids- Pedialyte, Gatorade, water, broth- to maintain hydration.   Bland diet as tolerated.  Zofran every 8 hours as needed for nausea or vomiting.   Complete prednisone pack to help with your breathing.  Use of inhaler as needed for wheezing or shortness of breath.   Self isolate until covid results are back and negative.  Will notify you by phone of any positive findings. Your negative results will be sent through your MyChart.     You may try some vitamins to help your immune system potentially:  Vitamin C 500mg  twice a day. Zinc 50mg  daily. Vitamin D 5000IU daily.   Over the counter medications as needed for symptoms.  If symptoms worsen or do not improve in the next week to return to be seen or to follow up with your PCP.      ED Prescriptions    Medication Sig Dispense Auth. Provider   predniSONE (DELTASONE) 20 MG tablet 3-3-3-2-2-2-1-1-1 18 tablet Jamari Moten B, NP   ondansetron (ZOFRAN) 4 MG tablet Take 1 tablet (4 mg total) by mouth every 8 (eight) hours as needed for nausea or vomiting. 10 tablet , NP     PDMP not reviewed this encounter.   07-03-2006, NP 03/02/20 1151

## 2020-03-02 NOTE — ED Triage Notes (Signed)
Pt c/o productive cough with green sputum, congestion, runny nose, sore throat, chills, fever onset yesterday. Awoke this morning with n/v, SOB and loss of taste/smell.  Took albuterol inhaler (no spacer) last night. No Tylenol or ibuprofen this morning. Bilateral wheezes and coarse sounds auscultated and diminished exchange noted.  Denies ear pain or other c/o

## 2020-03-02 NOTE — Discharge Instructions (Signed)
Small frequent sips of fluids- Pedialyte, Gatorade, water, broth- to maintain hydration.   Bland diet as tolerated.  Zofran every 8 hours as needed for nausea or vomiting.   Complete prednisone pack to help with your breathing.  Use of inhaler as needed for wheezing or shortness of breath.   Self isolate until covid results are back and negative.  Will notify you by phone of any positive findings. Your negative results will be sent through your MyChart.     You may try some vitamins to help your immune system potentially:  Vitamin C 500mg  twice a day. Zinc 50mg  daily. Vitamin D 5000IU daily.   Over the counter medications as needed for symptoms.  If symptoms worsen or do not improve in the next week to return to be seen or to follow up with your PCP.

## 2020-05-26 ENCOUNTER — Ambulatory Visit (HOSPITAL_COMMUNITY)
Admission: EM | Admit: 2020-05-26 | Discharge: 2020-05-26 | Disposition: A | Discharge status: Home / Self Care | Payer: Medicaid Other | Attending: Family Medicine | Admitting: Family Medicine

## 2020-05-26 ENCOUNTER — Encounter (HOSPITAL_COMMUNITY): Payer: Self-pay

## 2020-05-26 ENCOUNTER — Other Ambulatory Visit: Payer: Self-pay

## 2020-05-26 DIAGNOSIS — Z789 Other specified health status: Secondary | ICD-10-CM | POA: Diagnosis not present

## 2020-05-26 DIAGNOSIS — M795 Residual foreign body in soft tissue: Secondary | ICD-10-CM | POA: Diagnosis not present

## 2020-05-26 HISTORY — DX: Schizoaffective disorder, unspecified: F25.9

## 2020-05-26 MED ORDER — HYDROCODONE-ACETAMINOPHEN 5-325 MG PO TABS: 1.0000 | ORAL_TABLET | Freq: Four times a day (QID) | ORAL | 0 refills | Status: DC | PRN

## 2020-05-26 NOTE — Discharge Instructions (Addendum)
Rinse mouth with warm salt water 3-4 times a day Stitch will come out by itself Return as needed Pain medicine as needed Do not drive on pain medicine

## 2020-05-26 NOTE — ED Provider Notes (Signed)
[facility]    CSN: [id number] Arrival date & time: 05/26/20  1640      History   Chief Complaint Chief Complaint  Patient presents with  . lip piercing problem    HPI Diane Mann is a 19 y.o. female.   HPI   Has a face piercing in lower lip for 3+ months. The back is imbedded in her lip for the last 2+ months.  Wants it removed today  Past Medical History:  Diagnosis Date  . Anxiety   . Asthma   . Asthma 04/24/2015  . 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 Congenital heart problem    blockage  . Headache   . Obesity   . Panic attacks 04/24/2015  . RBBB (right bundle branch block) 04/24/2015  . Schizoaffective disorder (HCC)   . Social anxiety disorder 04/24/2015    Patient Active Problem List   Diagnosis Date Noted  . Major depressive disorder, recurrent episode, severe (HCC) 08/19/2018  . 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 Social anxiety disorder 04/24/2015  . Panic attacks 04/24/2015  . RBBB (right bundle branch block) 04/24/2015  . 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 Asthma 04/24/2015  . Oligomenorrhea 04/09/2015  . Thyroid function test abnormal 08/05/2014  . 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 Obesity 08/05/2014  . Short stature 08/05/2014    Past Surgical History:  Procedure Laterality Date  . MOUTH SURGERY  05/04/2017   Wisdom teeth ectraction Bilateral     OB History   No obstetric history on file.      Home Medications    Prior to Admission medications   Medication Sig Start Date End Date Taking? Authorizing Provider  acetaminophen (TYLENOL) 325 MG tablet Take 2 tablets (650 mg total) by mouth every 6 (six) hours as needed. Patient taking differently: Take 650 mg by mouth every 6 (six) hours as needed for headache.  10/01/19   Darr, Gerilyn Pilgrim, PA-C  albuterol (PROVENTIL) (2.5 MG/3ML) 0.083% nebulizer solution Take 3 mLs (2.5 mg total) by nebulization every  6 (six) hours as needed for wheezing or shortness of breath. 06/13/19   Wieters, Hallie C, PA-C  albuterol (VENTOLIN HFA) 108 (90 Base) MCG/ACT inhaler Inhale 1-2 puffs into the lungs every 6 (six) hours as needed for wheezing or shortness of breath. 06/13/19   Wieters, Hallie C, PA-C  ARIPiprazole (ABILIFY) 10 MG tablet Take 10 mg by mouth daily. 10/31/19   [provider]  dicyclomine (BENTYL) 20 MG tablet Take 1 tablet (20 mg total) by mouth 2 (two) times daily. 11/06/19   Joy, Shawn C, PA-C  HYDROcodone-acetaminophen (NORCO/VICODIN) 5-325 MG tablet Take 1-2 tablets by mouth every 6 (six) hours as needed. 05/26/20   Eustace Moore, MD  hydrOXYzine (ATARAX/VISTARIL) 25 MG tablet Take 25 mg by mouth at bedtime.  10/31/19   [provider]  norethindrone-ethinyl estradiol-iron (MICROGESTIN FE 1.5/30) 1.5-30 MG-MCG tablet Take 1 tablet by mouth daily. Skip placebo pills x 2 months. On the third month- take the placebo (brown) pills. 06/12/19   Dessa Phi, MD  traZODone (DESYREL) 50 MG tablet Take 50-100 mg by mouth at bedtime as needed for sleep.    [provider]  citalopram (CELEXA) 10 MG tablet Take 1 tablet (10 mg total) by mouth daily. For mood 08/25/18 03/05/19  Aldean Baker, NP  famotidine (PEPCID) 20 MG tablet Take 1 tablet (20 mg total) by  mouth 2 (two) times daily. Patient not taking: Reported on 11/06/2019 10/01/19 05/26/20  Darr, Gerilyn Pilgrim, PA-C  zolpidem (AMBIEN) 5 MG tablet Take 1 tablet (5 mg total) by mouth at bedtime as needed for sleep. 08/24/18 03/05/19  Aldean Baker, NP    Family History Family History  Problem Relation Age of Onset  . Lupus Mother   . Heart disease Father     Social History Social History   Tobacco Use  . Smoking status: Current Every Day Smoker    Packs/day: 0.20  . Smokeless tobacco: Never Used  . Tobacco comment:  5 perday  Vaping Use  . Vaping Use: Never used  Substance Use Topics  . Alcohol use: No  . Drug use: No      Allergies   Patient has no known allergies.   Review of Systems Review of Systems See HPI  Physical Exam Triage Vital Signs ED Triage Vitals  Enc Vitals Group     BP 05/26/20 1737 (!) 158/77     Pulse Rate 05/26/20 1737 88     Resp 05/26/20 1737 20     Temp 05/26/20 1737 98.6 F (37 C)     Temp Source 05/26/20 1737 Oral     SpO2 05/26/20 1737 98 %     Weight --      Height --      Head Circumference --      Peak Flow --      Pain Score 05/26/20 1735 0     Pain Loc --      Pain Edu? --      Excl. in GC? --    No data found.  Updated Vital Signs BP (!) 158/77 (BP Location: Left Arm)   Pulse 88   Temp 98.6 F (37 C) (Oral)   Resp 20   SpO2 98%       Physical Exam Constitutional:      General: She is not in acute distress.    Appearance: She is well-developed. She is obese.  HENT:     Head: Normocephalic and atraumatic.     Mouth/Throat:   Eyes:     Conjunctiva/sclera: Conjunctivae normal.     Pupils: Pupils are equal, round, and reactive to light.  Cardiovascular:     Rate and Rhythm: Normal rate.  Pulmonary:     Effort: Pulmonary effort is normal. No respiratory distress.  Abdominal:     General: There is no distension.     Palpations: Abdomen is soft.  Musculoskeletal:        General: Normal range of motion.     Cervical back: Normal range of motion.  Skin:    General: Skin is warm and dry.  Neurological:     Mental Status: She is alert.      UC Treatments / Results  Labs (all labs ordered are listed, but only abnormal results are displayed) Labs Reviewed - No data to display  EKG   Radiology No results found.  Procedures Foreign Body Removal  Date/Time: 05/26/2020 6:35 PM Performed by: Eustace Moore, MD Authorized by: Eustace Moore, MD   Consent:    Consent obtained:  Verbal   Consent given by:  Patient   Risks discussed:  Bleeding, infection and pain   Alternatives discussed:  No treatment Location:     Location:  Face   Face location:  Lower outer lip   Depth:  Subcutaneous Anesthesia (see MAR for exact dosages):  Anesthesia method:  Local infiltration   Local anesthetic:  Lidocaine 1% WITH epi Procedure type:    Procedure complexity:  Simple Procedure details:    Scalpel size:  15   Incision length:  5 mm   Localization method:  Probed   Bloodless field: no     Removal mechanism:  Hemostat   Foreign bodies recovered:  1   Description:  Patient piercing removed   Intact foreign body removal: yes   Post-procedure details:    Neurovascular status: intact     Skin closure:  Sutures   Suture size:  1-0   Suture material:  Chromic gut   Suture technique:  Simple interrupted   Patient tolerance of procedure:  Tolerated well, no immediate complications Comments:     The piercing was pressed through the inside of the lip and an incision made over the visible prominence.  It was then pushed through and removed.  One stitch was placed in the wound inside the lip.   (including critical care time)  Medications Ordered in UC Medications - No data to display  Initial Impression / Assessment and Plan / UC Course  I have reviewed the triage vital signs and the nursing notes.  Pertinent labs & imaging results that were available during my care of the patient were reviewed by me and considered in my medical decision making (see chart for details).     Final Clinical Impressions(s) / UC Diagnoses   Final diagnoses:  Body piercing  Foreign body (FB) in soft tissue     Discharge Instructions     Rinse mouth with warm salt water 3-4 times a day Stitch will come out by itself Return as needed Pain medicine as needed Do not drive on pain medicine   ED Prescriptions    Medication Sig Dispense Auth. Provider   HYDROcodone-acetaminophen (NORCO/VICODIN) 5-325 MG tablet Take 1-2 tablets by mouth every 6 (six) hours as needed. 10 tablet Eustace Moore, MD     I have reviewed  the PDMP during this encounter.   Eustace Moore, MD 05/26/20 [phone]

## 2020-05-26 NOTE — ED Triage Notes (Signed)
Pt in with c/o lip piercing issue. States that she has had lip piercing for 3 month but the skin has closed over the back side of the piercing and she is unable to remove it.

## 2020-11-30 ENCOUNTER — Encounter (HOSPITAL_COMMUNITY): Payer: Self-pay | Admitting: Emergency Medicine

## 2020-11-30 ENCOUNTER — Other Ambulatory Visit: Payer: Self-pay

## 2020-11-30 ENCOUNTER — Ambulatory Visit (HOSPITAL_COMMUNITY)
Admission: EM | Admit: 2020-11-30 | Discharge: 2020-11-30 | Disposition: A | Discharge status: Home / Self Care | Payer: Medicaid Other | Attending: Emergency Medicine | Admitting: Emergency Medicine

## 2020-11-30 DIAGNOSIS — M25562 Pain in left knee: Secondary | ICD-10-CM

## 2020-11-30 DIAGNOSIS — G8929 Other chronic pain: Secondary | ICD-10-CM

## 2020-11-30 DIAGNOSIS — M545 Low back pain, unspecified: Secondary | ICD-10-CM | POA: Diagnosis not present

## 2020-11-30 MED ORDER — KETOROLAC TROMETHAMINE 30 MG/ML IJ SOLN
INTRAMUSCULAR | Status: AC
  Filled 2020-11-30: qty 1

## 2020-11-30 MED ORDER — KETOROLAC TROMETHAMINE 30 MG/ML IJ SOLN
30.0000 mg | Freq: Once | INTRAMUSCULAR | Status: AC
  Administered 2020-11-30: 30 mg via INTRAMUSCULAR

## 2020-11-30 MED ORDER — IBUPROFEN 800 MG PO TABS
800.0000 mg | ORAL_TABLET | Freq: Three times a day (TID) | ORAL | 0 refills | Status: DC
Start: 2020-11-30 — End: 2021-01-17

## 2020-11-30 MED ORDER — CYCLOBENZAPRINE HCL 5 MG PO TABS: 5.0000 mg | ORAL_TABLET | Freq: Two times a day (BID) | ORAL | 0 refills | Status: DC

## 2020-11-30 NOTE — ED Provider Notes (Addendum)
[facility]    CSN: [id number] Arrival date & time: 11/30/20  1624      History   Chief Complaint Chief Complaint  Patient presents with  . Back Pain    HPI Diane Mann is a 20 y.o. female.   Patient presents with chronic left knee and lower back pain that has worsened over the last three weeks. Attributes to the amount of walking and standing done daily. Typically walks 7 blocks to and from walks and then stands for majority of shift that last 6-8 hours. Knee pain worsened with bending and back pain worsened with twist and turning. Back pain caused shooting pains up into shoulder after lying down last night. Denies numbness or tingling. Has attempted use of tylenol and knee brace with no relief.   Past Medical History:  Diagnosis Date  . Anxiety   . Asthma   . Asthma 04/24/2015  . 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 Congenital heart problem    blockage  . Headache   . Obesity   . Panic attacks 04/24/2015  . RBBB (right bundle branch block) 04/24/2015  . Schizoaffective disorder (HCC)   . Social anxiety disorder 04/24/2015    Patient Active Problem List   Diagnosis Date Noted  . Major depressive disorder, recurrent episode, severe (HCC) 08/19/2018  . 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 Social anxiety disorder 04/24/2015  . Panic attacks 04/24/2015  . RBBB (right bundle branch block) 04/24/2015  . 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 Asthma 04/24/2015  . Oligomenorrhea 04/09/2015  . Thyroid function test abnormal 08/05/2014  . 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 Obesity 08/05/2014  . Short stature 08/05/2014    Past Surgical History:  Procedure Laterality Date  . MOUTH SURGERY  05/04/2017   Wisdom teeth ectraction Bilateral     OB History   No obstetric history on file.      Home Medications    Prior to Admission medications   Medication Sig Start Date End Date  Taking? Authorizing Provider  ARIPiprazole (ABILIFY) 10 MG tablet Take 10 mg by mouth daily. 10/31/19  Yes [provider]  cyclobenzaprine (FLEXERIL) 5 MG tablet Take 1 tablet (5 mg total) by mouth in the morning and at bedtime. 11/30/20  Yes Jerrine Urschel R, NP  ibuprofen (ADVIL) 800 MG tablet Take 1 tablet (800 mg total) by mouth 3 (three) times daily. 11/30/20  Yes Johndavid Geralds, Elita Boone, NP  acetaminophen (TYLENOL) 325 MG tablet Take 2 tablets (650 mg total) by mouth every 6 (six) hours as needed. Patient taking differently: Take 650 mg by mouth every 6 (six) hours as needed for headache.  10/01/19   Darr, Gerilyn Pilgrim, PA-C  albuterol (PROVENTIL) (2.5 MG/3ML) 0.083% nebulizer solution Take 3 mLs (2.5 mg total) by nebulization every 6 (six) hours as needed for wheezing or shortness of breath. 06/13/19   Wieters, Hallie C, PA-C  albuterol (VENTOLIN HFA) 108 (90 Base) MCG/ACT inhaler Inhale 1-2 puffs into the lungs every 6 (six) hours as needed for wheezing or shortness of breath. 06/13/19   Wieters, Hallie C, PA-C  dicyclomine (BENTYL) 20 MG tablet Take 1 tablet (20 mg total) by mouth 2 (two) times daily. Patient not taking: Reported on 11/30/2020 11/06/19   Anselm Pancoast, PA-C  HYDROcodone-acetaminophen (NORCO/VICODIN) 5-325 MG tablet Take 1-2 tablets by mouth every 6 (six) hours as needed. Patient not taking: Reported on 11/30/2020 05/26/20  Eustace Moore, MD  hydrOXYzine (ATARAX/VISTARIL) 25 MG tablet Take 25 mg by mouth at bedtime.  Patient not taking: Reported on 11/30/2020 10/31/19   [provider]  norethindrone-ethinyl estradiol-iron (MICROGESTIN FE 1.5/30) 1.5-30 MG-MCG tablet Take 1 tablet by mouth daily. Skip placebo pills x 2 months. On the third month- take the placebo (brown) pills. Patient not taking: Reported on 11/30/2020 06/12/19   Dessa Phi, MD  traZODone (DESYREL) 50 MG tablet Take 50-100 mg by mouth at bedtime as needed for sleep.    [provider]  citalopram  (CELEXA) 10 MG tablet Take 1 tablet (10 mg total) by mouth daily. For mood 08/25/18 03/05/19  Aldean Baker, NP  famotidine (PEPCID) 20 MG tablet Take 1 tablet (20 mg total) by mouth 2 (two) times daily. Patient not taking: Reported on 11/06/2019 10/01/19 05/26/20  Darr, Gerilyn Pilgrim, PA-C  zolpidem (AMBIEN) 5 MG tablet Take 1 tablet (5 mg total) by mouth at bedtime as needed for sleep. 08/24/18 03/05/19  Aldean Baker, NP    Family History Family History  Problem Relation Age of Onset  . Lupus Mother   . Heart disease Father     Social History Social History   Tobacco Use  . Smoking status: Current Every Day Smoker    Packs/day: 0.20  . Smokeless tobacco: Never Used  . Tobacco comment:  5 perday  Vaping Use  . Vaping Use: Never used  Substance Use Topics  . Alcohol use: No  . Drug use: No     Allergies   Patient has no known allergies.   Review of Systems Review of Systems  Constitutional: Negative.   Respiratory: Negative.   Cardiovascular: Negative.   Musculoskeletal: Positive for back pain and joint swelling. Negative for arthralgias, gait problem, myalgias, neck pain and neck stiffness.  Skin: Negative.   Neurological: Negative.      Physical Exam Triage Vital Signs ED Triage Vitals  Enc Vitals Group     BP 11/30/20 1645 113/73     Pulse Rate 11/30/20 1645 82     Resp 11/30/20 1645 20     Temp 11/30/20 1645 98.1 F (36.7 C)     Temp Source 11/30/20 1645 Oral     SpO2 11/30/20 1645 98 %     Weight --      Height --      Head Circumference --      Peak Flow --      Pain Score 11/30/20 1642 10     Pain Loc --      Pain Edu? --      Excl. in GC? --    No data found.  Updated Vital Signs BP 113/73 (BP Location: Left Arm) Comment (BP Location): large cuff  Pulse 82   Temp 98.1 F (36.7 C) (Oral)   Resp 20   LMP 01/31/2020   SpO2 98%   Visual Acuity Right Eye Distance:   Left Eye Distance:   Bilateral Distance:    Right Eye Near:   Left Eye Near:     Bilateral Near:     Physical Exam Constitutional:      Appearance: Normal appearance. She is obese.  HENT:     Head: Normocephalic.  Eyes:     Extraocular Movements: Extraocular movements intact.  Pulmonary:     Effort: Pulmonary effort is normal.  Musculoskeletal:     Cervical back: Normal and normal range of motion.     Thoracic back: Normal.  Lumbar back: Tenderness present. No swelling or spasms. Normal range of motion.       Back:     Comments: Diffuse tenderness over anterior and lateral left knee, no point tenderness, erythema, effusion noted, mild swelling noted.   Skin:    General: Skin is warm and dry.  Neurological:     General: No focal deficit present.     Mental Status: She is alert and oriented to person, place, and time. Mental status is at baseline.  Psychiatric:        Mood and Affect: Mood normal.        Behavior: Behavior normal.        Thought Content: Thought content normal.        Judgment: Judgment normal.      UC Treatments / Results  Labs (all labs ordered are listed, but only abnormal results are displayed) Labs Reviewed - No data to display  EKG   Radiology No results found.  Procedures Procedures (including critical care time)  Medications Ordered in UC Medications  ketorolac (TORADOL) 30 MG/ML injection 30 mg (has no administration in time range)    Initial Impression / Assessment and Plan / UC Course  I have reviewed the triage vital signs and the nursing notes.  Pertinent labs & imaging results that were available during my care of the patient were reviewed by me and considered in my medical decision making (see chart for details).  Chronic left knee pain Chronic bilateral low back pain without sciatica  1. Flexeril 5 mg bid prn 2. Ibuprofen 800 mg tid  3. Discuss incorporating daily activities to help with pain such as stretching, bath soaks, supportive brace, heating pads  4. Follow up with orthopedic specialist for  persistent or worsening pain  5. Toradol 30 mg IM Now  Final Clinical Impressions(s) / UC Diagnoses   Final diagnoses:  Chronic pain of left knee  Acute bilateral low back pain without sciatica     Discharge Instructions     Can use flexeril twice a day as needed, be mindful this may make you drowsy, take first dose at night  Take ibuprofen three times a day with small snack for the next 5 days then as needed  Can continue use of tylenol every 6 hours as needed  Incorporate daily stretching of back and legs into routine  Can try warm soaks in a bathtub or heating pad in 15 minute intervals for comfort  May follow up with persistent or worsening pain, Can follow up with orthopedic specialist as well, information below        ED Prescriptions    Medication Sig Dispense Auth. Provider   ibuprofen (ADVIL) 800 MG tablet Take 1 tablet (800 mg total) by mouth 3 (three) times daily. 42 tablet Jacinta Penalver R, NP   cyclobenzaprine (FLEXERIL) 5 MG tablet Take 1 tablet (5 mg total) by mouth in the morning and at bedtime. 40 tablet Valinda Hoar, NP     PDMP not reviewed this encounter.   Valinda Hoar, NP 11/30/20 1714    Valinda Hoar, NP 11/30/20 1715

## 2020-11-30 NOTE — ED Triage Notes (Signed)
Complains of back pain and left knee.  History of this pain.  This episode of pain started 2-3 weeks ago.  Pain has gradually worsened over time.  No recent injury

## 2020-11-30 NOTE — Discharge Instructions (Signed)
Can use flexeril twice a day as needed, be mindful this may make you drowsy, take first dose at night  Take ibuprofen three times a day with small snack for the next 5 days then as needed  Can continue use of tylenol every 6 hours as needed  Incorporate daily stretching of back and legs into routine  Can try warm soaks in a bathtub or heating pad in 15 minute intervals for comfort  May follow up with persistent or worsening pain, Can follow up with orthopedic specialist as well, information below

## 2021-01-13 ENCOUNTER — Other Ambulatory Visit: Payer: Self-pay

## 2021-01-13 ENCOUNTER — Encounter (HOSPITAL_COMMUNITY): Payer: Self-pay

## 2021-01-13 ENCOUNTER — Emergency Department (HOSPITAL_COMMUNITY)
Admission: EM | Admit: 2021-01-13 | Discharge: 2021-01-13 | Disposition: A | Discharge status: Home / Self Care | Payer: Medicaid Other | Attending: Emergency Medicine | Admitting: Emergency Medicine

## 2021-01-13 ENCOUNTER — Emergency Department (HOSPITAL_COMMUNITY): Payer: Medicaid Other

## 2021-01-13 DIAGNOSIS — J45909 Unspecified asthma, uncomplicated: Secondary | ICD-10-CM | POA: Insufficient documentation

## 2021-01-13 DIAGNOSIS — Z79899 Other long term (current) drug therapy: Secondary | ICD-10-CM | POA: Insufficient documentation

## 2021-01-13 DIAGNOSIS — M25562 Pain in left knee: Secondary | ICD-10-CM | POA: Diagnosis present

## 2021-01-13 DIAGNOSIS — F17210 Nicotine dependence, cigarettes, uncomplicated: Secondary | ICD-10-CM | POA: Insufficient documentation

## 2021-01-13 MED ORDER — IBUPROFEN 800 MG PO TABS
800.0000 mg | ORAL_TABLET | Freq: Once | ORAL | Status: AC
  Administered 2021-01-13: 800 mg via ORAL
  Filled 2021-01-13: qty 1

## 2021-01-13 NOTE — ED Provider Notes (Signed)
[facility]-EMERGENCY DEPT Provider Note   CSN: [id number] Arrival date & time: 01/13/21  1451     History Chief Complaint  Patient presents with   Knee Pain    Diane Mann is a 20 y.o. female with a past medical history as noted below who presents to the ED due to intermittent left knee pain.  Patient states she fell on her knee a few years ago and has had issues ever since.  No recent injury.  Denies fever and chills.  Denies erythema, edema, and warmth to left knee.  Patient states pain is worse with ambulation. No treatment prior to arrival.  History obtained from patient and past medical records. No interpreter used during encounter.       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     Family History  Problem Relation Age of Onset   Lupus Mother    Heart disease Father     Social History   Tobacco Use   Smoking status: Every Day     Packs/day: 0.20    Types: Cigarettes   Smokeless tobacco: Never   Tobacco comments:     5 perday  Vaping Use   Vaping Use: Never used  Substance Use Topics   Alcohol use: No   Drug use: No    Home Medications Prior to Admission medications   Medication Sig Start Date End Date Taking? Authorizing Provider  acetaminophen (TYLENOL) 325 MG tablet Take 2 tablets (650 mg total) by mouth every 6 (six) hours as needed. Patient taking differently: Take 650 mg by mouth every 6 (six) hours as needed for headache.  10/01/19   Darr, Gerilyn Pilgrim, PA-C  albuterol (PROVENTIL) (2.5 MG/3ML) 0.083% nebulizer solution Take 3 mLs (2.5 mg total) by nebulization every 6 (six) hours as needed for wheezing or shortness of breath. 06/13/19   Wieters, Hallie C, PA-C  albuterol (VENTOLIN HFA) 108 (90 Base) MCG/ACT inhaler Inhale 1-2 puffs into the lungs every 6 (six) hours as needed for wheezing or shortness of breath. 06/13/19   Wieters, Hallie C, PA-C  ARIPiprazole (ABILIFY) 10 MG tablet Take 10 mg by mouth daily. 10/31/19   [provider]  cyclobenzaprine (FLEXERIL) 5 MG tablet Take 1 tablet (5 mg total) by mouth in the morning and at bedtime. 11/30/20   Valinda Hoar, NP  dicyclomine (BENTYL) 20 MG  tablet Take 1 tablet (20 mg total) by mouth 2 (two) times daily. Patient not taking: Reported on 11/30/2020 11/06/19   Anselm Pancoast, PA-C  HYDROcodone-acetaminophen (NORCO/VICODIN) 5-325 MG tablet Take 1-2 tablets by mouth every 6 (six) hours as needed. Patient not taking: Reported on 11/30/2020 05/26/20   Eustace Moore, MD  hydrOXYzine (ATARAX/VISTARIL) 25 MG tablet Take 25 mg by mouth at bedtime.  Patient not taking: Reported on 11/30/2020 10/31/19   [provider]  ibuprofen (ADVIL) 800 MG tablet Take 1 tablet (800 mg total) by mouth 3 (three) times daily. 11/30/20   White, Elita Boone, NP  norethindrone-ethinyl estradiol-iron (MICROGESTIN FE 1.5/30) 1.5-30 MG-MCG tablet Take 1 tablet by mouth daily. Skip  placebo pills x 2 months. On the third month- take the placebo (brown) pills. Patient not taking: Reported on 11/30/2020 06/12/19   Dessa Phi, MD  traZODone (DESYREL) 50 MG tablet Take 50-100 mg by mouth at bedtime as needed for sleep.    [provider]  citalopram (CELEXA) 10 MG tablet Take 1 tablet (10 mg total) by mouth daily. For mood 08/25/18 03/05/19  Aldean Baker, NP  famotidine (PEPCID) 20 MG tablet Take 1 tablet (20 mg total) by mouth 2 (two) times daily. Patient not taking: Reported on 11/06/2019 10/01/19 05/26/20  Darr, Gerilyn Pilgrim, PA-C  zolpidem (AMBIEN) 5 MG tablet Take 1 tablet (5 mg total) by mouth at bedtime as needed for sleep. 08/24/18 03/05/19  Aldean Baker, NP    Allergies    Patient has no known allergies.  Review of Systems   Review of Systems  Musculoskeletal:  Positive for arthralgias.  All other systems reviewed and are negative.  Physical Exam Updated Vital Signs BP 116/71 (BP Location: Left Arm)   Pulse 88   Temp 98.7 F (37.1 C) (Oral)   Resp 18   Ht 5\' 1"  (1.549 m)   Wt 114.3 kg   SpO2 95%   BMI 47.60 kg/m   Physical Exam Vitals and nursing note reviewed.  Constitutional:      General: She is not in acute distress.    Appearance: She is not ill-appearing.  HENT:     Head: Normocephalic.  Eyes:     Pupils: Pupils are equal, round, and reactive to light.  Cardiovascular:     Rate and Rhythm: Normal rate and regular rhythm.     Pulses: Normal pulses.     Heart sounds: Normal heart sounds. No murmur heard.   No friction rub. No gallop.  Pulmonary:     Effort: Pulmonary effort is normal.     Breath sounds: Normal breath sounds.  Abdominal:     General: Abdomen is flat. There is no distension.     Palpations: Abdomen is soft.     Tenderness: There is no abdominal tenderness. There is no guarding or rebound.  Musculoskeletal:        General: Normal range of motion.     Cervical back: Neck supple.     Comments: Full range of motion of  left knee.  No erythema, edema, or warmth.  Soft compartments.  Pedal pulses palpable.  Skin:    General: Skin is warm and dry.  Neurological:     General: No focal deficit present.     Mental Status: She is alert.  Psychiatric:        Mood and Affect: Mood normal.        Behavior: Behavior normal.    ED Results / Procedures /  Treatments   Labs (all labs ordered are listed, but only abnormal results are displayed) Labs Reviewed - No data to display  EKG None  Radiology DG Knee Complete 4 Views Left  Result Date: 01/13/2021 CLINICAL DATA:  Left knee pain. EXAM: LEFT KNEE - COMPLETE 4+ VIEW COMPARISON:  X-ray left knee 05/09/2018, x-ray left knee 10/09/2017 FINDINGS: No evidence of fracture, dislocation, or joint effusion. Prominent pre femoral fat pad. No evidence of arthropathy or other focal bone abnormality. Soft tissues are unremarkable. IMPRESSION: Negative. Electronically Signed   By: Tish Frederickson M.D.   On: 01/13/2021 16:26    Procedures Procedures   Medications Ordered in ED Medications  ibuprofen (ADVIL) tablet 800 mg (has no administration in time range)    ED Course  I have reviewed the triage vital signs and the nursing notes.  Pertinent labs & imaging results that were available during my care of the patient were reviewed by me and considered in my medical decision making (see chart for details).    MDM Rules/Calculators/A&P                          20 year old female presents to the ED due to persistent left knee pain for the past few years which has worsened over the past month.  History of remote injury however, no recent injury.  No fever or chills.  Upon arrival, patient afebrile, not tachycardic or hypoxic.  Patient in no acute distress.  Physical exam reassuring.  Full range of motion of left knee.  No erythema, edema, or warmth.  Low suspicion for septic joint.  No lower extremity edema.  No chest pain or shortness of breath.  Low suspicion for DVT.   X-ray ordered at triage which I personally reviewed which is negative for any bony fractures or acute abnormalities.  Ace wrap placed for comfort.  Patient discharged with crutches.  Advised patient take over-the-counter ibuprofen or Tylenol as needed for pain.  Patient discharged with orthopedics number.  Advised patient to call if symptoms not improve over the next week. Strict ED precautions discussed with patient. Patient states understanding and agrees to plan. Patient discharged home in no acute distress and stable vitals  Final Clinical Impression(s) / ED Diagnoses Final diagnoses:  Acute pain of left knee    Rx / DC Orders ED Discharge Orders     None        Jesusita Oka 01/13/21 2026    Koleen Distance, MD 01/13/21 2218

## 2021-01-13 NOTE — Discharge Instructions (Addendum)
It was a pleasure taking care of you today.  As discussed, your x-ray was negative for any broken bones.  Continue to ice and elevate your leg.  I have included the number of the orthopedic surgeon.  Please call if symptoms do not improve within the next week for further evaluation.  You may take over-the-counter ibuprofen or Tylenol as needed for pain.  Return to the ER for new or worsening symptoms.

## 2021-01-13 NOTE — ED Triage Notes (Signed)
Patient c/o left knee pain. Patient states she hurt her left knee approx a month ago.

## 2021-01-17 ENCOUNTER — Other Ambulatory Visit: Payer: Self-pay

## 2021-01-17 ENCOUNTER — Ambulatory Visit (HOSPITAL_COMMUNITY)
Admission: EM | Admit: 2021-01-17 | Discharge: 2021-01-17 | Disposition: A | Discharge status: Home / Self Care | Payer: Medicaid Other | Attending: Emergency Medicine | Admitting: Emergency Medicine

## 2021-01-17 ENCOUNTER — Encounter (HOSPITAL_COMMUNITY): Payer: Self-pay | Admitting: *Deleted

## 2021-01-17 DIAGNOSIS — G8929 Other chronic pain: Secondary | ICD-10-CM

## 2021-01-17 DIAGNOSIS — M25562 Pain in left knee: Secondary | ICD-10-CM | POA: Diagnosis not present

## 2021-01-17 MED ORDER — MELOXICAM 7.5 MG PO TABS: 7.5000 mg | ORAL_TABLET | Freq: Every day | ORAL | 0 refills | Status: DC

## 2021-01-17 NOTE — ED Triage Notes (Signed)
Pt report Lt knee swelling for one week . Pt reports over use of knee.

## 2021-01-17 NOTE — ED Provider Notes (Signed)
[facility]    CSN: [id number] Arrival date & time: 01/17/21  1324      History   Chief Complaint Chief Complaint  Patient presents with   Joint Swelling    HPI Diane Mann is a 20 y.o. female.   Patient here for evaluation of left knee swelling that has been ongoing for the past several weeks.  Patient has been seen and evaluated including xray that were normal.  Reports taking OTC medication with minimal relief.  Was given crutches but states that they are too painful to use.  Reports pain is achy and constant and worse with movement. Denies any trauma, injury, or other precipitating event.  Denies any fevers, chest pain, shortness of breath, N/V/D, numbness, tingling, weakness, abdominal pain, or headaches.     The history is provided by the patient.   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   Congenital heart problem    blockage   Headache    Obesity    Panic attacks 04/24/2015   RBBB (right bundle branch block) 04/24/2015   Schizoaffective disorder (HCC)    Social anxiety disorder 04/24/2015    Patient Active Problem List   Diagnosis Date Noted   Major depressive disorder, recurrent episode, severe (HCC) 08/19/2018   Morbid obesity with body mass index (BMI) greater than 99th percentile for age in childhood (HCC) 05/19/2016   Social anxiety disorder 04/24/2015   Panic attacks 04/24/2015   RBBB (right bundle branch block) 04/24/2015   AV block, 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      Home Medications    Prior to Admission medications    Medication Sig Start Date End Date Taking? Authorizing Provider  albuterol (PROVENTIL) (2.5 MG/3ML) 0.083% nebulizer solution Take 3 mLs (2.5 mg total) by nebulization every 6 (six) hours as needed for wheezing or shortness of breath. 06/13/19  Yes Wieters, Hallie C, PA-C  cyclobenzaprine (FLEXERIL) 5 MG tablet Take 1 tablet (5 mg total) by mouth in the morning and at bedtime. 11/30/20  Yes White, Adrienne R, NP  meloxicam (MOBIC) 7.5 MG tablet Take 1 tablet (7.5 mg total) by mouth daily. 01/17/21  Yes Ivette Loyal, NP  acetaminophen (TYLENOL) 325 MG tablet Take 2 tablets (650 mg total) by mouth every 6 (six) hours as needed. Patient taking differently: Take 650 mg by mouth every 6 (six) hours as needed for headache.  10/01/19   Darr, Gerilyn Pilgrim, PA-C  albuterol (VENTOLIN HFA) 108 (90 Base) MCG/ACT inhaler Inhale 1-2 puffs into the lungs every 6 (six) hours as needed for wheezing or shortness of breath. 06/13/19   Wieters, Hallie C, PA-C  ARIPiprazole (ABILIFY) 10 MG tablet Take 10 mg by mouth daily. 10/31/19   [provider]  dicyclomine (BENTYL) 20 MG tablet Take 1 tablet (20 mg total) by mouth 2 (two) times daily. Patient not taking: Reported on 11/30/2020 11/06/19   Anselm Pancoast, PA-C  HYDROcodone-acetaminophen (NORCO/VICODIN) 5-325 MG tablet Take 1-2 tablets by mouth every 6 (six) hours as needed. Patient  not taking: Reported on 11/30/2020 05/26/20   Eustace Moore, MD  hydrOXYzine (ATARAX/VISTARIL) 25 MG tablet Take 25 mg by mouth at bedtime.  Patient not taking: Reported on 11/30/2020 10/31/19   [provider]  norethindrone-ethinyl estradiol-iron (MICROGESTIN FE 1.5/30) 1.5-30 MG-MCG tablet Take 1 tablet by mouth daily. Skip placebo pills x 2 months. On the third month- take the placebo (brown) pills. Patient not taking: Reported on 11/30/2020 06/12/19   Dessa Phi, MD  traZODone (DESYREL) 50 MG tablet Take 50-100 mg by mouth at bedtime as needed for sleep.    [provider]  citalopram (CELEXA) 10 MG tablet Take 1 tablet (10 mg total) by mouth daily. For mood 08/25/18 03/05/19  Aldean Baker, NP  famotidine (PEPCID) 20 MG tablet Take 1 tablet (20 mg total) by mouth 2 (two) times daily. Patient not taking: Reported on 11/06/2019 10/01/19 05/26/20  Darr, Gerilyn Pilgrim, PA-C  zolpidem (AMBIEN) 5 MG tablet Take 1 tablet (5 mg total) by mouth at bedtime as needed for sleep. 08/24/18 03/05/19  Aldean Baker, NP    Family History Family History  Problem Relation Age of Onset   Lupus Mother    Heart disease Father     Social History Social History   Tobacco Use   Smoking status: Every Day    Packs/day: 0.20    Types: Cigarettes   Smokeless tobacco: Never   Tobacco comments:     5 perday  Vaping Use   Vaping Use: Never used  Substance Use Topics   Alcohol use: No   Drug use: No     Allergies   Patient has no known allergies.   Review of Systems Review of Systems  Musculoskeletal:  Positive for arthralgias and joint swelling.  All other systems reviewed and are negative.   Physical Exam Triage Vital Signs ED Triage Vitals  Enc Vitals Group     BP 01/17/21 1427 113/63     Pulse Rate 01/17/21 1427 88     Resp 01/17/21 1427 18     Temp 01/17/21 1427 98.7 F (37.1 C)     Temp src --      SpO2 01/17/21 1427 97 %     Weight --      Height --      Head Circumference --      Peak Flow --      Pain Score 01/17/21 1422 10     Pain Loc --      Pain Edu? --      Excl. in GC? --    No data found.  Updated Vital Signs BP 113/63   Pulse 88   Temp 98.7 F (37.1 C)   Resp 18   LMP 12/10/2020 (Approximate)   SpO2 97%   Visual Acuity Right Eye Distance:   Left Eye Distance:   Bilateral Distance:    Right Eye Near:   Left Eye Near:    Bilateral Near:     Physical Exam Vitals and nursing note reviewed.  Constitutional:      General: She is not in acute distress.    Appearance: Normal appearance. She is not ill-appearing, toxic-appearing or  diaphoretic.  HENT:     Head: Normocephalic and atraumatic.  Eyes:     Conjunctiva/sclera: Conjunctivae normal.  Cardiovascular:     Rate and Rhythm: Normal rate.     Pulses: Normal pulses.  Pulmonary:     Effort: Pulmonary effort is normal.  Abdominal:  General: Abdomen is flat.  Musculoskeletal:        General: Normal range of motion.     Cervical back: Normal range of motion.     Right knee: Normal.     Left knee: Swelling present. No effusion, erythema or bony tenderness. Normal range of motion. No tenderness.  Skin:    General: Skin is warm and dry.  Neurological:     General: No focal deficit present.     Mental Status: She is alert and oriented to person, place, and time.  Psychiatric:        Mood and Affect: Mood normal.     UC Treatments / Results  Labs (all labs ordered are listed, but only abnormal results are displayed) Labs Reviewed - No data to display  EKG   Radiology No results found.  Procedures Procedures (including critical care time)  Medications Ordered in UC Medications - No data to display  Initial Impression / Assessment and Plan / UC Course  I have reviewed the triage vital signs and the nursing notes.  Pertinent labs & imaging results that were available during my care of the patient were reviewed by me and considered in my medical decision making (see chart for details).    Assessment negative for red flags or concerns.  Xray from 7/19 normal with no acute bony abnormality.  Possible inflammatory process.  Will treat with meloxicam daily.  Recommend wearing ace bandage as previously told.  Recommend rest, ice, and elevation.  Follow up with orthopedics is symptoms do not improve.  Final Clinical Impressions(s) / UC Diagnoses   Final diagnoses:  Chronic pain of left knee     Discharge Instructions      Take the meloxicam daily to help with your knee pain.    Continue to wear the ace bandage for comfort.  Rest as much as  possible Ice for 10-15 minutes every 4-6 hours as needed for pain and swelling Elevate above your hip/heart when sitting and laying down  Follow up with sports medicine or orthopedics if symptoms do not improve in the next few days.      ED Prescriptions     Medication Sig Dispense Auth. Provider   meloxicam (MOBIC) 7.5 MG tablet Take 1 tablet (7.5 mg total) by mouth daily. 30 tablet Ivette Loyal, NP      PDMP not reviewed this encounter.   Ivette Loyal, NP 01/17/21 1459

## 2021-01-17 NOTE — Discharge Instructions (Addendum)
Take the meloxicam daily to help with your knee pain.    Continue to wear the ace bandage for comfort.  Rest as much as possible Ice for 10-15 minutes every 4-6 hours as needed for pain and swelling Elevate above your hip/heart when sitting and laying down  Follow up with sports medicine or orthopedics if symptoms do not improve in the next few days.

## 2021-04-20 ENCOUNTER — Other Ambulatory Visit: Payer: Self-pay

## 2021-04-20 ENCOUNTER — Encounter (HOSPITAL_COMMUNITY): Payer: Self-pay

## 2021-04-20 ENCOUNTER — Ambulatory Visit (HOSPITAL_COMMUNITY)
Admission: EM | Admit: 2021-04-20 | Discharge: 2021-04-20 | Disposition: A | Discharge status: Home / Self Care | Payer: Medicaid Other | Attending: Emergency Medicine | Admitting: Emergency Medicine

## 2021-04-20 DIAGNOSIS — Z711 Person with feared health complaint in whom no diagnosis is made: Secondary | ICD-10-CM

## 2021-04-20 DIAGNOSIS — H18821 Corneal disorder due to contact lens, right eye: Secondary | ICD-10-CM | POA: Diagnosis not present

## 2021-04-20 LAB — CBG MONITORING, ED: Glucose-Capillary: 86 mg/dL (ref 70–99)

## 2021-04-20 MED ORDER — POLYMYXIN B-TRIMETHOPRIM 10000-0.1 UNIT/ML-% OP SOLN: 1.0000 [drp] | Freq: Four times a day (QID) | OPHTHALMIC | 0 refills | Status: DC

## 2021-04-20 MED ORDER — TETRACAINE HCL 0.5 % OP SOLN
OPHTHALMIC | Status: AC
  Filled 2021-04-20: qty 4

## 2021-04-20 MED ORDER — FLUORESCEIN SODIUM 1 MG OP STRP
ORAL_STRIP | OPHTHALMIC | Status: AC
  Filled 2021-04-20: qty 1

## 2021-04-20 NOTE — ED Provider Notes (Signed)
[facility]    CSN: [id number] Arrival date & time: 04/20/21  1130      History   Chief Complaint Chief Complaint  Patient presents with   Eye Injury    HPI Diane Mann is a 20 y.o. female. Last night at work, she accidentally hit herself with cardboard in the R eye. Feels vision has been blurry since. Does not feel like there is a foreign body in eye. C/o eye pain.     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   Congenital heart problem    blockage   Headache    Obesity    Panic attacks 04/24/2015   RBBB (right bundle branch block) 04/24/2015   Schizoaffective disorder (HCC)    Social anxiety disorder 04/24/2015    Patient Active Problem List   Diagnosis Date Noted   Major depressive disorder, recurrent episode, severe (HCC) 08/19/2018   Morbid obesity with body mass index (BMI) greater than 99th percentile for age in childhood (HCC) 05/19/2016   Social anxiety disorder 04/24/2015   Panic attacks 04/24/2015   RBBB (right bundle branch block) 04/24/2015   AV block, 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      Home Medications    Prior to Admission medications   Medication Sig Start Date End Date Taking? Authorizing Provider  trimethoprim-polymyxin b (POLYTRIM) ophthalmic solution Place 1 drop into the right eye in the morning, at noon, in the evening, and at bedtime. 04/20/21  Yes Cathlyn Parsons, NP  acetaminophen (TYLENOL) 325 MG tablet Take 2 tablets (650 mg total) by mouth every 6 (six) hours as needed. Patient taking  differently: Take 650 mg by mouth every 6 (six) hours as needed for headache.  10/01/19   Darr, Gerilyn Pilgrim, PA-C  albuterol (PROVENTIL) (2.5 MG/3ML) 0.083% nebulizer solution Take 3 mLs (2.5 mg total) by nebulization every 6 (six) hours as needed for wheezing or shortness of breath. 06/13/19   Wieters, Hallie C, PA-C  albuterol (VENTOLIN HFA) 108 (90 Base) MCG/ACT inhaler Inhale 1-2 puffs into the lungs every 6 (six) hours as needed for wheezing or shortness of breath. 06/13/19   Wieters, Hallie C, PA-C  ARIPiprazole (ABILIFY) 10 MG tablet Take 10 mg by mouth daily. 10/31/19   [provider]  cyclobenzaprine (FLEXERIL) 5 MG tablet Take 1 tablet (5 mg total) by mouth in the morning and at bedtime. 11/30/20   White, Elita Boone, NP  dicyclomine (BENTYL) 20 MG tablet Take 1 tablet (20 mg total) by mouth 2 (two) times daily. Patient not taking: Reported on 11/30/2020 11/06/19   Anselm Pancoast, PA-C  HYDROcodone-acetaminophen (NORCO/VICODIN) 5-325 MG tablet Take 1-2 tablets by mouth every 6 (six) hours as needed. Patient not taking: Reported on 11/30/2020 05/26/20   Eustace Moore, MD  hydrOXYzine (ATARAX/VISTARIL) 25 MG tablet Take 25 mg by mouth at bedtime.  Patient not taking: Reported on 11/30/2020 10/31/19   [provider]  meloxicam (MOBIC) 7.5 MG tablet Take 1 tablet (7.5 mg total)  by mouth daily. 01/17/21   Ivette Loyal, NP  norethindrone-ethinyl estradiol-iron (MICROGESTIN FE 1.5/30) 1.5-30 MG-MCG tablet Take 1 tablet by mouth daily. Skip placebo pills x 2 months. On the third month- take the placebo (brown) pills. Patient not taking: Reported on 11/30/2020 06/12/19   Dessa Phi, MD  traZODone (DESYREL) 50 MG tablet Take 50-100 mg by mouth at bedtime as needed for sleep.    [provider]  citalopram (CELEXA) 10 MG tablet Take 1 tablet (10 mg total) by mouth daily. For mood 08/25/18 03/05/19  Aldean Baker, NP  famotidine (PEPCID) 20 MG tablet Take 1 tablet (20 mg total) by mouth 2  (two) times daily. Patient not taking: Reported on 11/06/2019 10/01/19 05/26/20  Darr, Gerilyn Pilgrim, PA-C  zolpidem (AMBIEN) 5 MG tablet Take 1 tablet (5 mg total) by mouth at bedtime as needed for sleep. 08/24/18 03/05/19  Aldean Baker, NP    Family History Family History  Problem Relation Age of Onset   Lupus Mother    Heart disease Father     Social History Social History   Tobacco Use   Smoking status: Every Day    Packs/day: 0.20    Types: Cigarettes   Smokeless tobacco: Never   Tobacco comments:     5 perday  Vaping Use   Vaping Use: Never used  Substance Use Topics   Alcohol use: No   Drug use: No     Allergies   Patient has no known allergies.   Review of Systems Review of Systems  Eyes:  Positive for photophobia, pain, redness and visual disturbance. Negative for discharge.    Physical Exam Triage Vital Signs ED Triage Vitals  Enc Vitals Group     BP 04/20/21 1406 116/79     Pulse Rate 04/20/21 1406 77     Resp 04/20/21 1406 18     Temp 04/20/21 1406 98 F (36.7 C)     Temp Source 04/20/21 1406 Oral     SpO2 04/20/21 1406 97 %     Weight --      Height --      Head Circumference --      Peak Flow --      Pain Score 04/20/21 1405 7     Pain Loc --      Pain Edu? --      Excl. in GC? --    No data found.  Updated Vital Signs BP 116/79 (BP Location: Left Arm)   Pulse 77   Temp 98 F (36.7 C) (Oral)   Resp 18   SpO2 97%   Visual Acuity Right Eye Distance:   Left Eye Distance:   Bilateral Distance:    Right Eye Near:   Left Eye Near:    Bilateral Near:     Physical Exam Constitutional:      Appearance: Normal appearance. She is obese.  Eyes:     General: Lids are normal.        Right eye: No foreign body, discharge or hordeolum.     Conjunctiva/sclera:     Right eye: Right conjunctiva is not injected. No chemosis, exudate or hemorrhage.    Pupils: Pupils are equal.     Right eye: Fluorescein uptake present.      Comments: Small spot of  fluorescein uptake in medial aspect of cornea near pupil  Neurological:     Mental Status: She is alert.     UC Treatments / Results  Labs (  all labs ordered are listed, but only abnormal results are displayed) Labs Reviewed  CBG MONITORING, ED    EKG   Radiology No results found.  Procedures Procedures (including critical care time)  Medications Ordered in UC Medications - No data to display  Initial Impression / Assessment and Plan / UC Course  I have reviewed the triage vital signs and the nursing notes.  Pertinent labs & imaging results that were available during my care of the patient were reviewed by me and considered in my medical decision making (see chart for details).   Likely small corneal abrasion given uptake of fluorescein in R eye. Rx polytrim drops, referred to ophthalmology on call.   While in Urgent care, pt requested her glucose be checked- she reports she is prediabetic and hasn't eaten yet today, is worried she might be hypoglycemic. CBG 86. Encouraged pt to eat ASAP.   Final Clinical Impressions(s) / UC Diagnoses   Final diagnoses:  Corneal abrasion due to contact lens, right     Discharge Instructions      Call the ophthalmologist's office today to set up an appointment for corneal abrasion.    ED Prescriptions     Medication Sig Dispense Auth. Provider   trimethoprim-polymyxin b (POLYTRIM) ophthalmic solution Place 1 drop into the right eye in the morning, at noon, in the evening, and at bedtime. 10 mL Cathlyn Parsons, NP      PDMP not reviewed this encounter.   Cathlyn Parsons, NP 04/20/21 1450

## 2021-04-20 NOTE — ED Triage Notes (Signed)
Pt presents with right eye injury after being hit in eye with corner of cardboard box last night.

## 2021-04-20 NOTE — Discharge Instructions (Addendum)
Call the ophthalmologist's office today to set up an appointment for corneal abrasion.

## 2021-05-01 ENCOUNTER — Encounter (HOSPITAL_COMMUNITY): Payer: Self-pay

## 2021-05-01 ENCOUNTER — Ambulatory Visit (HOSPITAL_COMMUNITY)
Admission: EM | Admit: 2021-05-01 | Discharge: 2021-05-01 | Disposition: A | Discharge status: Home / Self Care | Payer: Medicaid Other | Attending: Physician Assistant | Admitting: Physician Assistant

## 2021-05-01 ENCOUNTER — Other Ambulatory Visit: Payer: Self-pay

## 2021-05-01 DIAGNOSIS — R112 Nausea with vomiting, unspecified: Secondary | ICD-10-CM | POA: Insufficient documentation

## 2021-05-01 DIAGNOSIS — J069 Acute upper respiratory infection, unspecified: Secondary | ICD-10-CM | POA: Diagnosis not present

## 2021-05-01 DIAGNOSIS — R051 Acute cough: Secondary | ICD-10-CM | POA: Diagnosis present

## 2021-05-01 DIAGNOSIS — F17210 Nicotine dependence, cigarettes, uncomplicated: Secondary | ICD-10-CM | POA: Insufficient documentation

## 2021-05-01 DIAGNOSIS — J45909 Unspecified asthma, uncomplicated: Secondary | ICD-10-CM | POA: Diagnosis not present

## 2021-05-01 DIAGNOSIS — Z20822 Contact with and (suspected) exposure to covid-19: Secondary | ICD-10-CM | POA: Diagnosis not present

## 2021-05-01 LAB — POC INFLUENZA A AND B ANTIGEN (URGENT CARE ONLY)
INFLUENZA A ANTIGEN, POC: NEGATIVE
INFLUENZA B ANTIGEN, POC: NEGATIVE

## 2021-05-01 MED ORDER — PROMETHAZINE-DM 6.25-15 MG/5ML PO SYRP: 5.0000 mL | ORAL_SOLUTION | Freq: Four times a day (QID) | ORAL | 0 refills | Status: DC | PRN

## 2021-05-01 MED ORDER — ALBUTEROL SULFATE HFA 108 (90 BASE) MCG/ACT IN AERS
2.0000 | INHALATION_SPRAY | Freq: Once | RESPIRATORY_TRACT | Status: AC
  Administered 2021-05-01: 2 via RESPIRATORY_TRACT

## 2021-05-01 MED ORDER — ONDANSETRON 4 MG PO TBDP
4.0000 mg | ORAL_TABLET | Freq: Once | ORAL | Status: AC
  Administered 2021-05-01: 4 mg via ORAL

## 2021-05-01 MED ORDER — ONDANSETRON 4 MG PO TBDP
ORAL_TABLET | ORAL | Status: AC
  Filled 2021-05-01: qty 1

## 2021-05-01 MED ORDER — ALBUTEROL SULFATE HFA 108 (90 BASE) MCG/ACT IN AERS
INHALATION_SPRAY | RESPIRATORY_TRACT | Status: AC
  Filled 2021-05-01: qty 6.7

## 2021-05-01 NOTE — Discharge Instructions (Signed)
I believe they have a virus.  Please stay Promethazine DM up to 4 times a day as needed for cough/nausea.  This make you sleepy do not drive or drink alcohol while taking it.  You were negative for flu today.  We will contact you if your COVID is positive.  Use your albuterol inhaler every 4-6 hours as needed for shortness of breath and coughing fits.  I recommend using Mucinex, Flonase, Tylenol for additional symptom relief.  Make sure you are drinking plenty of fluid.  If you have any worsening symptoms including severe shortness of breath, chest pain, nausea/vomiting preventing you from eating and drinking, or other concerns you need to go to the emergency room.

## 2021-05-01 NOTE — ED Provider Notes (Signed)
[facility]    CSN: [id number] Arrival date & time: 05/01/21  1429      History   Chief Complaint Chief Complaint  Patient presents with   Cough   Headache   Nausea    HPI LESHAE MCCLAY is a 20 y.o. female.   Patient presents today with a 1 day history of URI symptoms.  Reports headache, cough, nausea, vomiting, fatigue, nasal congestion.  Denies any abdominal pain.  She does report her asthma is flared as result of symptoms and she is not experiencing chest tightness and some shortness of breath.  She does not have an albuterol inhaler available and is requesting a refill today.  She has tried NyQuil at night without improvement of symptoms.  Reports sick contacts at work diagnosed with viral illness.  She has not had influenza vaccination.  She did have 1 COVID-19 vaccine but not complete the series.  She has not had COVID-19 recently.  She denies any recent antibiotic use.   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   Congenital heart problem    blockage   Headache    Obesity    Panic attacks 04/24/2015   RBBB (right bundle branch block) 04/24/2015   Schizoaffective disorder (HCC)    Social anxiety disorder 04/24/2015    Patient Active Problem List   Diagnosis Date Noted   Major depressive disorder, recurrent episode, severe (HCC) 08/19/2018   Morbid obesity with body mass index (BMI) greater than 99th percentile for age in childhood (HCC) 05/19/2016   Social anxiety disorder 04/24/2015   Panic attacks 04/24/2015   RBBB (right bundle branch block) 04/24/2015   AV block, 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      Home Medications    Prior to Admission medications   Medication Sig Start Date End Date Taking? Authorizing Provider  promethazine-dextromethorphan (PROMETHAZINE-DM) 6.25-15 MG/5ML syrup Take 5 mLs by mouth 4 (four) times daily as needed for cough. 05/01/21  Yes Alixis Harmon K, PA-C  albuterol (PROVENTIL) (2.5 MG/3ML) 0.083% nebulizer solution Take 3 mLs (2.5 mg total) by nebulization every 6 (six) hours as needed for wheezing or shortness of breath. 06/13/19   Wieters, Hallie C, PA-C  albuterol (VENTOLIN HFA) 108 (90 Base) MCG/ACT inhaler Inhale 1-2 puffs into the lungs every 6 (six) hours as needed for wheezing or shortness of breath. 06/13/19   Wieters, Hallie C, PA-C  citalopram (CELEXA) 10 MG tablet Take 1 tablet (10 mg total) by mouth daily. For mood 08/25/18 03/05/19  Aldean Baker, NP  famotidine (PEPCID) 20 MG tablet Take 1 tablet (20 mg total) by mouth 2 (two) times daily. Patient not taking: Reported on 11/06/2019 10/01/19 05/26/20  Darr, Gerilyn Pilgrim, PA-C  zolpidem (AMBIEN) 5 MG tablet Take 1 tablet (5 mg total) by mouth at bedtime as needed for sleep. 08/24/18 03/05/19  Aldean Baker, NP    Family History Family History  Problem Relation Age of Onset   Lupus Mother    Heart disease Father  Social History Social History   Tobacco Use   Smoking status: Every Day    Packs/day: 0.20    Types: Cigarettes   Smokeless tobacco: Never   Tobacco comments:     5 perday  Vaping Use   Vaping Use: Never used  Substance Use Topics   Alcohol use: No   Drug use: No     Allergies   Patient has no known allergies.   Review of Systems Review of Systems  Constitutional:  Positive for activity change and fatigue. Negative for appetite change and fever.  HENT:  Positive for congestion and sore throat. Negative for sinus pressure and sneezing.   Respiratory:  Positive for cough, chest tightness and shortness of breath.    Cardiovascular:  Negative for chest pain.  Gastrointestinal:  Positive for nausea and vomiting. Negative for abdominal pain and diarrhea.  Musculoskeletal:  Negative for arthralgias and myalgias.  Neurological:  Positive for headaches. Negative for dizziness and light-headedness.    Physical Exam Triage Vital Signs ED Triage Vitals  Enc Vitals Group     BP 05/01/21 1534 137/81     Pulse Rate 05/01/21 1534 78     Resp 05/01/21 1534 18     Temp 05/01/21 1534 98.6 F (37 C)     Temp Source 05/01/21 1534 Oral     SpO2 05/01/21 1534 96 %     Weight --      Height --      Head Circumference --      Peak Flow --      Pain Score 05/01/21 1532 10     Pain Loc --      Pain Edu? --      Excl. in GC? --    No data found.  Updated Vital Signs BP 137/81 (BP Location: Left Arm)   Pulse 78   Temp 98.6 F (37 C) (Oral)   Resp 18   SpO2 96%   Visual Acuity Right Eye Distance:   Left Eye Distance:   Bilateral Distance:    Right Eye Near:   Left Eye Near:    Bilateral Near:     Physical Exam Vitals reviewed.  Constitutional:      General: She is awake. She is not in acute distress.    Appearance: Normal appearance. She is well-developed. She is not ill-appearing.     Comments: Very pleasant female appears stated age in no acute distress sitting comfortably in exam room  HENT:     Head: Normocephalic and atraumatic.     Right Ear: Tympanic membrane, ear canal and external ear normal. Tympanic membrane is not erythematous or bulging.     Left Ear: Ear canal and external ear normal. Tympanic membrane is injected. Tympanic membrane is not erythematous or bulging.     Nose:     Right Sinus: No maxillary sinus tenderness or frontal sinus tenderness.     Left Sinus: No maxillary sinus tenderness or frontal sinus tenderness.     Mouth/Throat:     Pharynx: Uvula midline. Posterior oropharyngeal erythema present. No oropharyngeal exudate.  Cardiovascular:     Rate and Rhythm: Normal  rate and regular rhythm.     Heart sounds: Normal heart sounds, S1 normal and S2 normal. No murmur heard. Pulmonary:     Effort: Pulmonary effort is normal.     Breath sounds: Normal breath sounds. No wheezing, rhonchi or rales.     Comments: Reactive cough with deep breathing Psychiatric:  Behavior: Behavior is cooperative.     UC Treatments / Results  Labs (all labs ordered are listed, but only abnormal results are displayed) Labs Reviewed  SARS CORONAVIRUS 2 (TAT 6-24 HRS)  POC INFLUENZA A AND B ANTIGEN (URGENT CARE ONLY)    EKG   Radiology No results found.  Procedures Procedures (including critical care time)  Medications Ordered in UC Medications  ondansetron (ZOFRAN-ODT) disintegrating tablet 4 mg (4 mg Oral Given 05/01/21 1623)  albuterol (VENTOLIN HFA) 108 (90 Base) MCG/ACT inhaler 2 puff (2 puffs Inhalation Given 05/01/21 1623)    Initial Impression / Assessment and Plan / UC Course  I have reviewed the triage vital signs and the nursing notes.  Pertinent labs & imaging results that were available during my care of the patient were reviewed by me and considered in my medical decision making (see chart for details).     Discussed likely viral etiology.  Flu testing was negative.  COVID test is pending.  No evidence of acute infection that warrant initiation of antibiotics today.  Patient was given albuterol with minimal improvement of symptoms.  She was sent home with albuterol inhaler with instruction to use every 4-6 hours as needed.  She was given Zofran in clinic but this did not provide any relief of symptoms.  We will try Promethazine DM to manage cough and nausea symptoms.  Recommend she use Flonase, Mucinex, Tylenol for additional symptom relief.  She is to rest and drink plenty of fluid.  Discussed alarm symptoms that warrant emergent evaluation.  She was provided work excuse note with current CDC return to work guidelines based on COVID-19 testing  results.  Strict return precautions given to which she expressed understanding.  Final Clinical Impressions(s) / UC Diagnoses   Final diagnoses:  Upper respiratory tract infection, unspecified type  Acute cough  Nausea and vomiting, unspecified vomiting type     Discharge Instructions      I believe they have a virus.  Please stay Promethazine DM up to 4 times a day as needed for cough/nausea.  This make you sleepy do not drive or drink alcohol while taking it.  You were negative for flu today.  We will contact you if your COVID is positive.  Use your albuterol inhaler every 4-6 hours as needed for shortness of breath and coughing fits.  I recommend using Mucinex, Flonase, Tylenol for additional symptom relief.  Make sure you are drinking plenty of fluid.  If you have any worsening symptoms including severe shortness of breath, chest pain, nausea/vomiting preventing you from eating and drinking, or other concerns you need to go to the emergency room.     ED Prescriptions     Medication Sig Dispense Auth. Provider   promethazine-dextromethorphan (PROMETHAZINE-DM) 6.25-15 MG/5ML syrup Take 5 mLs by mouth 4 (four) times daily as needed for cough. 118 mL Sailor Hevia K, PA-C      PDMP not reviewed this encounter.   Jeani Hawking, PA-C 05/01/21 1643

## 2021-05-01 NOTE — ED Notes (Signed)
2 tablets of Zofran were removed from Pyxis as pt dropped the 1st tablet.

## 2021-05-01 NOTE — ED Triage Notes (Signed)
Pt reports headache, cough, nausea and vomiting x 1 day, NyQuil gives no relief.   Requested albuterol inhaler.

## 2021-05-02 LAB — SARS CORONAVIRUS 2 (TAT 6-24 HRS): SARS Coronavirus 2: NEGATIVE

## 2021-08-02 ENCOUNTER — Emergency Department (HOSPITAL_COMMUNITY): Payer: Medicaid Other

## 2021-08-02 ENCOUNTER — Other Ambulatory Visit: Payer: Self-pay

## 2021-08-02 ENCOUNTER — Encounter (HOSPITAL_COMMUNITY): Payer: Self-pay

## 2021-08-02 ENCOUNTER — Emergency Department (HOSPITAL_COMMUNITY)
Admission: EM | Admit: 2021-08-02 | Discharge: 2021-08-02 | Disposition: A | Discharge status: Home / Self Care | Payer: Medicaid Other | Attending: Emergency Medicine | Admitting: Emergency Medicine

## 2021-08-02 DIAGNOSIS — R1013 Epigastric pain: Secondary | ICD-10-CM | POA: Diagnosis present

## 2021-08-02 DIAGNOSIS — K760 Fatty (change of) liver, not elsewhere classified: Secondary | ICD-10-CM | POA: Insufficient documentation

## 2021-08-02 DIAGNOSIS — K219 Gastro-esophageal reflux disease without esophagitis: Secondary | ICD-10-CM | POA: Insufficient documentation

## 2021-08-02 DIAGNOSIS — N9489 Other specified conditions associated with female genital organs and menstrual cycle: Secondary | ICD-10-CM | POA: Insufficient documentation

## 2021-08-02 LAB — URINALYSIS, ROUTINE W REFLEX MICROSCOPIC
Bacteria, UA: NONE SEEN
Bilirubin Urine: NEGATIVE
Glucose, UA: NEGATIVE mg/dL
Hgb urine dipstick: NEGATIVE
Ketones, ur: NEGATIVE mg/dL
Leukocytes,Ua: NEGATIVE
Nitrite: NEGATIVE
Protein, ur: NEGATIVE mg/dL
Specific Gravity, Urine: 1.025 (ref 1.005–1.030)
pH: 6 (ref 5.0–8.0)

## 2021-08-02 LAB — CBC WITH DIFFERENTIAL/PLATELET
Abs Immature Granulocytes: 0.02 10*3/uL (ref 0.00–0.07)
Basophils Absolute: 0.1 10*3/uL (ref 0.0–0.1)
Basophils Relative: 1 %
Eosinophils Absolute: 0.2 10*3/uL (ref 0.0–0.5)
Eosinophils Relative: 2 %
HCT: 35.8 % — ABNORMAL LOW (ref 36.0–46.0)
Hemoglobin: 11.4 g/dL — ABNORMAL LOW (ref 12.0–15.0)
Immature Granulocytes: 0 %
Lymphocytes Relative: 41 %
Lymphs Abs: 4.1 10*3/uL — ABNORMAL HIGH (ref 0.7–4.0)
MCH: 31.5 pg (ref 26.0–34.0)
MCHC: 31.8 g/dL (ref 30.0–36.0)
MCV: 98.9 fL (ref 80.0–100.0)
Monocytes Absolute: 0.7 10*3/uL (ref 0.1–1.0)
Monocytes Relative: 7 %
Neutro Abs: 4.9 10*3/uL (ref 1.7–7.7)
Neutrophils Relative %: 49 %
Platelets: 294 10*3/uL (ref 150–400)
RBC: 3.62 MIL/uL — ABNORMAL LOW (ref 3.87–5.11)
RDW: 13.2 % (ref 11.5–15.5)
WBC: 9.9 10*3/uL (ref 4.0–10.5)
nRBC: 0 % (ref 0.0–0.2)

## 2021-08-02 LAB — COMPREHENSIVE METABOLIC PANEL
ALT: 21 U/L (ref 0–44)
AST: 27 U/L (ref 15–41)
Albumin: 4.3 g/dL (ref 3.5–5.0)
Alkaline Phosphatase: 41 U/L (ref 38–126)
Anion gap: 6 (ref 5–15)
BUN: 13 mg/dL (ref 6–20)
CO2: 25 mmol/L (ref 22–32)
Calcium: 9.1 mg/dL (ref 8.9–10.3)
Chloride: 108 mmol/L (ref 98–111)
Creatinine, Ser: 1 mg/dL (ref 0.44–1.00)
GFR, Estimated: >60 mL/min (ref >60)
Glucose, Bld: 95 mg/dL (ref 70–99)
Potassium: 3.9 mmol/L (ref 3.5–5.1)
Sodium: 139 mmol/L (ref 135–145)
Total Bilirubin: 0.3 mg/dL (ref 0.3–1.2)
Total Protein: 7.6 g/dL (ref 6.5–8.1)

## 2021-08-02 LAB — HCG, QUANTITATIVE, PREGNANCY: hCG, Beta Chain, Quant, S: <1 m[IU]/mL (ref <5)

## 2021-08-02 LAB — LIPASE, BLOOD: Lipase: 36 U/L (ref 11–51)

## 2021-08-02 MED ORDER — SUCRALFATE 1 G PO TABS: 1.0000 g | ORAL_TABLET | Freq: Four times a day (QID) | ORAL | 0 refills | Status: DC

## 2021-08-02 MED ORDER — ALUM & MAG HYDROXIDE-SIMETH 200-200-20 MG/5ML PO SUSP
30.0000 mL | Freq: Once | ORAL | Status: AC
  Administered 2021-08-02: 30 mL via ORAL
  Filled 2021-08-02: qty 30

## 2021-08-02 MED ORDER — PANTOPRAZOLE SODIUM 20 MG PO TBEC: 20.0000 mg | DELAYED_RELEASE_TABLET | Freq: Every day | ORAL | 0 refills | Status: DC

## 2021-08-02 MED ORDER — LIDOCAINE VISCOUS HCL 2 % MT SOLN
15.0000 mL | Freq: Once | OROMUCOSAL | Status: AC
  Administered 2021-08-02: 15 mL via ORAL
  Filled 2021-08-02: qty 15

## 2021-08-02 MED ORDER — FAMOTIDINE 20 MG PO TABS
40.0000 mg | ORAL_TABLET | Freq: Once | ORAL | Status: AC
  Administered 2021-08-02: 40 mg via ORAL
  Filled 2021-08-02: qty 2

## 2021-08-02 MED ORDER — SUCRALFATE 1 G PO TABS
1.0000 g | ORAL_TABLET | Freq: Once | ORAL | Status: AC
Start: 2021-08-02 — End: 2021-08-02
  Administered 2021-08-02: 1 g via ORAL
  Filled 2021-08-02: qty 1

## 2021-08-02 NOTE — ED Provider Notes (Signed)
[facility]-EMERGENCY DEPT Provider Note   CSN: [id number] Arrival date & time: 08/02/21  1945     History  Chief Complaint  Patient presents with   Abdominal Pain   Chest Pain    Diane Mann is a 21 y.o. female.  21 year old female presents with several days of epigastric discomfort which radiates to her upper quadrant.  Pain gets worse after she eats greasy spicy foods.  Has had emesis x2.  No fever.  No urinary, GYN complaints.  She did use Zofran and Pepto-Bismol with limited relief.  No prior history of abdominal surgeries.      Home Medications Prior to Admission medications   Medication Sig Start Date End Date Taking? Authorizing Provider  albuterol (PROVENTIL) (2.5 MG/3ML) 0.083% nebulizer solution Take 3 mLs (2.5 mg total) by nebulization every 6 (six) hours as needed for wheezing or shortness of breath. 06/13/19   Wieters, Hallie C, PA-C  albuterol (VENTOLIN HFA) 108 (90 Base) MCG/ACT inhaler Inhale 1-2 puffs into the lungs every 6 (six) hours as needed for wheezing or shortness of breath. 06/13/19   Wieters, Hallie C, PA-C  promethazine-dextromethorphan (PROMETHAZINE-DM) 6.25-15 MG/5ML syrup Take 5 mLs by mouth 4 (four) times daily as needed for cough. 05/01/21   Raspet, Noberto Retort, PA-C  citalopram (CELEXA) 10 MG tablet Take 1 tablet (10 mg total) by mouth daily. For mood 08/25/18 03/05/19  Aldean Baker, NP  famotidine (PEPCID) 20 MG tablet Take 1 tablet (20 mg total) by mouth 2 (two) times daily. Patient not taking: Reported on 11/06/2019 10/01/19 05/26/20  Darr, Gerilyn Pilgrim, PA-C  zolpidem (AMBIEN) 5 MG tablet Take 1 tablet (5 mg total) by mouth at bedtime as needed for sleep. 08/24/18 03/05/19  Aldean Baker, NP      Allergies    Patient has no known allergies.    Review of Systems   Review of Systems  All other systems reviewed and are negative.  Physical Exam Updated Vital Signs BP 136/86 (BP Location: Right Arm)    Pulse 87    Temp 98 F (36.7 C)  (Oral)    Resp 16    Ht 1.549 m (5\' 1" )    Wt 113.4 kg    SpO2 93%    BMI 47.24 kg/m  Physical Exam Vitals and nursing note reviewed.  Constitutional:      General: She is not in acute distress.    Appearance: Normal appearance. She is well-developed. She is not toxic-appearing.  HENT:     Head: Normocephalic and atraumatic.  Eyes:     General: Lids are normal.     Conjunctiva/sclera: Conjunctivae normal.     Pupils: Pupils are equal, round, and reactive to light.  Neck:     Thyroid: No thyroid mass.     Trachea: No tracheal deviation.  Cardiovascular:     Rate and Rhythm: Normal rate and regular rhythm.     Heart sounds: Normal heart sounds. No murmur heard.   No gallop.  Pulmonary:     Effort: Pulmonary effort is normal. No respiratory distress.     Breath sounds: Normal breath sounds. No stridor. No decreased breath sounds, wheezing, rhonchi or rales.  Abdominal:     General: There is no distension.     Palpations: Abdomen is soft.     Tenderness: There is abdominal tenderness in the right upper quadrant. There is no rebound.    Musculoskeletal:        General: No tenderness.  Normal range of motion.     Cervical back: Normal range of motion and neck supple.  Skin:    General: Skin is warm and dry.     Findings: No abrasion or rash.  Neurological:     Mental Status: She is alert and oriented to person, place, and time. Mental status is at baseline.     GCS: GCS eye subscore is 4. GCS verbal subscore is 5. GCS motor subscore is 6.     Cranial Nerves: No cranial nerve deficit.     Sensory: No sensory deficit.     Motor: Motor function is intact.  Psychiatric:        Attention and Perception: Attention normal.        Speech: Speech normal.        Behavior: Behavior normal.    ED Results / Procedures / Treatments   Labs (all labs ordered are listed, but only abnormal results are displayed) Labs Reviewed  COMPREHENSIVE METABOLIC PANEL  LIPASE, BLOOD  CBC WITH  DIFFERENTIAL/PLATELET  URINALYSIS, ROUTINE W REFLEX MICROSCOPIC  I-STAT BETA HCG BLOOD, ED ([facility], [facility], [facility] ONLY)    EKG EKG Interpretation  Date/Time:  Sunday August 02 2021 20:13:06 EST Ventricular Rate:  71 PR Interval:  205 QRS Duration: 160 QT Interval:  439 QTC Calculation: 478 R Axis:   70 Text Interpretation: Sinus rhythm Borderline prolonged PR interval Right bundle branch block since last tracing no significant change Confirmed by Mancel Bale [phone]) on 08/02/2021 8:54:30 PM  Radiology No results found.  Procedures Procedures    Medications Ordered in ED Medications  alum & mag hydroxide-simeth (MAALOX/MYLANTA) 200-200-20 MG/5ML suspension 30 mL (has no administration in time range)    And  lidocaine (XYLOCAINE) 2 % viscous mouth solution 15 mL (has no administration in time range)  sucralfate (CARAFATE) tablet 1 g (has no administration in time range)  famotidine (PEPCID) tablet 40 mg (has no administration in time range)    ED Course/ Medical Decision Making/ A&P                           Medical Decision Making Amount and/or Complexity of Data Reviewed Labs: ordered. Radiology: ordered.  Risk OTC drugs. Prescription drug management.  Patient is EKG per my interpretation shows sinus rhythm.  No evidence of acute ischemia. Patient medicated for reflux here and does feel better.  Concern for possible gallbladder etiology and abdominal ultrasound and labs were performed.  No evidence of cholelithiasis.  No leukocytosis noted.  Patient's LFTs are normal.  She is not pregnant.  Will place on medication for GERD and have her follow-up with her doctor        Final Clinical Impression(s) / ED Diagnoses Final diagnoses:  None    Rx / DC Orders ED Discharge Orders     None         Lorre Nick, MD 08/02/21 2135

## 2021-08-02 NOTE — ED Triage Notes (Signed)
Pt complains of abdominal pain and chest pain after eating x 3 days. Pt states that she is unable to keep anything down and when she tries to eat it burns her chest.

## 2021-10-14 ENCOUNTER — Encounter (HOSPITAL_COMMUNITY): Payer: Self-pay

## 2021-10-14 ENCOUNTER — Ambulatory Visit (HOSPITAL_COMMUNITY)
Admission: EM | Admit: 2021-10-14 | Discharge: 2021-10-14 | Disposition: A | Discharge status: Home / Self Care | Payer: Medicaid Other | Attending: Emergency Medicine | Admitting: Emergency Medicine

## 2021-10-14 DIAGNOSIS — N611 Abscess of the breast and nipple: Secondary | ICD-10-CM

## 2021-10-14 MED ORDER — DOXYCYCLINE HYCLATE 100 MG PO CAPS: 100.0000 mg | ORAL_CAPSULE | Freq: Two times a day (BID) | ORAL | 0 refills | Status: DC

## 2021-10-14 NOTE — Discharge Instructions (Signed)
Take doxycycline twice daily for 7 days ? ?Hold warm-hot compresses to affected area at least 4 times a day, this helps to facilitate draining, the more the better ? ?Please return for evaluation for increased swelling, increased tenderness or pain, non healing site, non draining site, you begin to have fever or chills  ? ?We reviewed the etiology of recurrent abscesses of skin.  Skin abscesses are collections of pus within the dermis and deeper skin tissues. Skin abscesses manifest as painful, tender, fluctuant, and erythematous nodules, frequently surmounted by a pustule and surrounded by a rim of erythematous swelling.  Spontaneous drainage of purulent material may occur.  Fever can occur on occasion.   ? ?-Skin abscesses can develop in healthy individuals with no predisposing conditions other than skin or nasal carriage of Staphylococcus aureus.  Individuals in close contact with others who have active infection with skin abscesses are at increased risk which is likely to explain why twin brother has similar episodes.   In addition, any process leading to a breach in the skin barrier can also predispose to the development of a skin abscesses, such as atopic dermatitis.    ?

## 2021-10-14 NOTE — ED Provider Notes (Signed)
?[facility] ? ? ? ?CSN: [id number] ?Arrival date & time: 10/14/21  1706 ? ? ?  ? ?History   ?Chief Complaint ?Chief Complaint  ?Patient presents with  ? Abscess  ? ? ?HPI ?Diane Mann is a 21 y.o. female.  ? ?Patient presents with cyst located underneath the left breast.  Endorses that it has been present for at least 2 years but became erythematous and tender over the last 6 days.  Able to expel pus with squeezing.  Denies fever or chills.  ? ?Past Medical History:  ?Diagnosis Date  ? Anxiety   ? Asthma   ? Asthma 04/24/2015  ? 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 Congenital heart problem   ? blockage  ? Headache   ? Obesity   ? Panic attacks 04/24/2015  ? RBBB (right bundle branch block) 04/24/2015  ? Schizoaffective disorder (HCC)   ? Social anxiety disorder 04/24/2015  ? ? ?Patient Active Problem List  ? Diagnosis Date Noted  ? Major depressive disorder, recurrent episode, severe (HCC) 08/19/2018  ? Morbid obesity with body mass index (BMI) greater than 99th percentile for age in childhood [facility](HCC) 05/19/2016  ? Social anxiety disorder 04/24/2015  ? Panic attacks 04/24/2015  ? RBBB (right bundle branch block) 04/24/2015  ? 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 Asthma 04/24/2015  ? Oligomenorrhea 04/09/2015  ? Thyroid function test abnormal 08/05/2014  ? 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 Obesity 08/05/2014  ? Short stature 08/05/2014  ? ? ?Past Surgical History:  ?Procedure Laterality Date  ? MOUTH SURGERY  05/04/2017  ? Wisdom teeth ectraction Bilateral   ? ? ?OB History   ?No obstetric history on file. ?  ? ? ? ?Home Medications   ? ?Prior to Admission medications   ?Medication Sig Start Date End Date Taking? Authorizing Provider  ?albuterol (PROVENTIL) (2.5 MG/3ML) 0.083% nebulizer solution Take 3 mLs (2.5 mg total) by nebulization every 6 (six) hours as needed for wheezing or shortness of breath. 06/13/19   Wieters, Hallie C, PA-C  ?albuterol (VENTOLIN HFA) 108 (90 Base) MCG/ACT inhaler Inhale 1-2 puffs  into the lungs every 6 (six) hours as needed for wheezing or shortness of breath. 06/13/19   Wieters, Hallie C, PA-C  ?pantoprazole (PROTONIX) 20 MG tablet Take 1 tablet (20 mg total) by mouth daily. 08/02/21   Lorre NickAllen, Anthony, MD  ?promethazine-dextromethorphan (PROMETHAZINE-DM) 6.25-15 MG/5ML syrup Take 5 mLs by mouth 4 (four) times daily as needed for cough. 05/01/21   Raspet, Noberto RetortErin K, PA-C  ?sucralfate (CARAFATE) 1 g tablet Take 1 tablet (1 g total) by mouth 4 (four) times daily. 08/02/21   Lorre NickAllen, Anthony, MD  ?citalopram (CELEXA) 10 MG tablet Take 1 tablet (10 mg total) by mouth daily. For mood 08/25/18 03/05/19  Aldean BakerSykes, Janet E, NP  ?famotidine (PEPCID) 20 MG tablet Take 1 tablet (20 mg total) by mouth 2 (two) times daily. ?Patient not taking: Reported on 11/06/2019 10/01/19 05/26/20  Darr, Gerilyn PilgrimJacob, PA-C  ?zolpidem (AMBIEN) 5 MG tablet Take 1 tablet (5 mg total) by mouth at bedtime as needed for sleep. 08/24/18 03/05/19  Aldean BakerSykes, Janet E, NP  ? ? ?Family History ?Family History  ?Problem Relation Age of Onset  ? Lupus Mother   ? Heart disease Father   ? ? ?Social History ?Social History  ? ?Tobacco Use  ? Smoking status: Every Day  ?  Packs/day: 0.20  ?  Types: Cigarettes  ? Smokeless tobacco: Never  ? Tobacco comments:  ?  5 perday  ?Vaping Use  ? Vaping Use: Never used  ?Substance Use Topics  ? Alcohol use: No  ? Drug use: No  ? ? ? ?Allergies   ?Patient has no known allergies. ? ? ?Review of Systems ?Review of Systems  ?Constitutional: Negative.   ?Respiratory: Negative.    ?Cardiovascular: Negative.   ?Skin:  Positive for wound. Negative for color change, pallor and rash.  ?Neurological: Negative.   ? ? ?Physical Exam ?Triage Vital Signs ?ED Triage Vitals  ?Enc Vitals Group  ?   BP 10/14/21 1719 137/72  ?   Pulse Rate 10/14/21 1719 99  ?   Resp 10/14/21 1719 18  ?   Temp 10/14/21 1719 99.9 ?F (37.7 ?C)  ?   Temp Source 10/14/21 1719 Oral  ?   SpO2 10/14/21 1719 94 %  ?   Weight --   ?   Height --   ?   Head Circumference --    ?   Peak Flow --   ?   Pain Score 10/14/21 1716 10  ?   Pain Loc --   ?   Pain Edu? --   ?   Excl. in GC? --   ? ?No data found. ? ?Updated Vital Signs ?BP 137/72 (BP Location: Right Arm)   Pulse 99   Temp 99.9 ?F (37.7 ?C) (Oral)   Resp 18   LMP  (LMP Unknown)   SpO2 94%  ? ?Visual Acuity ?Right Eye Distance:   ?Left Eye Distance:   ?Bilateral Distance:   ? ?Right Eye Near:   ?Left Eye Near:    ?Bilateral Near:    ? ?Physical Exam ?Constitutional:   ?   Appearance: Normal appearance.  ?HENT:  ?   Head: Normocephalic.  ?Eyes:  ?   Extraocular Movements: Extraocular movements intact.  ?Pulmonary:  ?   Effort: Pulmonary effort is normal.  ?Skin: ?   Comments: 2 x 3 cm immature abscess present under the left breast, erythematous and tender but nondraining  ?Neurological:  ?   Mental Status: She is alert and oriented to person, place, and time. Mental status is at baseline.  ?Psychiatric:     ?   Mood and Affect: Mood normal.     ?   Behavior: Behavior normal.  ? ? ? ?UC Treatments / Results  ?Labs ?(all labs ordered are listed, but only abnormal results are displayed) ?Labs Reviewed - No data to display ? ?EKG ? ? ?Radiology ?No results found. ? ?Procedures ?Procedures (including critical care time) ? ?Medications Ordered in UC ?Medications - No data to display ? ?Initial Impression / Assessment and Plan / UC Course  ?I have reviewed the triage vital signs and the nursing notes. ? ?Pertinent labs & imaging results that were available during my care of the patient were reviewed by me and considered in my medical decision making (see chart for details). ? ?Abscess to the left breast ? ?Unable to complete incision and drainage at this time due to firmness of the abscess, discussed with patient, doxycycline 7-day course prescribed, may use over-the-counter Tylenol or ibuprofen for management of pain, recommended warm compresses to the affected area to help facilitate drainage, given strict precautions for nonhealing  nondraining site to return urgent care for reevaluation ?Final Clinical Impressions(s) / UC Diagnoses  ? ?Final diagnoses:  ?None  ? ?Discharge Instructions   ?None ?  ? ?ED Prescriptions   ?None ?  ? ?PDMP not reviewed this  encounter. ?  ?Valinda Hoar, NP ?10/14/21 1756 ? ?

## 2021-10-14 NOTE — ED Triage Notes (Signed)
Pt presents today with an abscess on upper stomach. Pt states its been there for a while,. However, it got bigger with time. Pt states pus and tenderness.  ?

## 2021-10-20 ENCOUNTER — Other Ambulatory Visit: Payer: Self-pay

## 2021-10-20 ENCOUNTER — Emergency Department (HOSPITAL_COMMUNITY)
Admission: EM | Admit: 2021-10-20 | Discharge: 2021-10-20 | Disposition: A | Discharge status: Home / Self Care | Payer: Medicaid Other | Attending: Emergency Medicine | Admitting: Emergency Medicine

## 2021-10-20 ENCOUNTER — Encounter (HOSPITAL_COMMUNITY): Payer: Self-pay

## 2021-10-20 DIAGNOSIS — J45909 Unspecified asthma, uncomplicated: Secondary | ICD-10-CM | POA: Insufficient documentation

## 2021-10-20 DIAGNOSIS — L02213 Cutaneous abscess of chest wall: Secondary | ICD-10-CM | POA: Diagnosis present

## 2021-10-20 DIAGNOSIS — L0291 Cutaneous abscess, unspecified: Secondary | ICD-10-CM

## 2021-10-20 MED ORDER — CEPHALEXIN 500 MG PO CAPS: 500.0000 mg | ORAL_CAPSULE | Freq: Four times a day (QID) | ORAL | 0 refills | Status: AC

## 2021-10-20 MED ORDER — LIDOCAINE-EPINEPHRINE 2 %-1:100000 IJ SOLN
20.0000 mL | Freq: Once | INTRAMUSCULAR | Status: AC
  Administered 2021-10-20: 20 mL
  Filled 2021-10-20: qty 1

## 2021-10-20 MED ORDER — CEPHALEXIN 500 MG PO CAPS: 500.0000 mg | ORAL_CAPSULE | Freq: Four times a day (QID) | ORAL | 0 refills | Status: DC

## 2021-10-20 MED ORDER — BACITRACIN ZINC 500 UNIT/GM EX OINT
TOPICAL_OINTMENT | Freq: Once | CUTANEOUS | Status: AC
  Administered 2021-10-20: 1 via TOPICAL
  Filled 2021-10-20: qty 0.9

## 2021-10-20 NOTE — ED Provider Notes (Signed)
?[facility] DEPT ?Provider Note ? ? ?CSN: [id number] ?Arrival date & time: 10/20/21  1445 ? ?  ? ?History ? ?Chief Complaint  ?Patient presents with  ? Abscess  ? ? ?Diane Mann is a 21 y.o. female with a past medical history of obesity, asthma, anxiety attacks presenting today with an abscess.  She says that she first noticed an area that appeared like a blackhead under her left breast and then over the past 3 weeks it is grown.  Over the past few days it is started to drain.  She endorses temperatures of 99.  She has never had 1 of these before, denies history of diabetes or IVDU.  She and her partner reports that at home they have tried warm soaks as well as tobacco from a cigarette soaked in a Band-Aid overnight. ? ? ? ?  ? ?Home Medications ?Prior to Admission medications   ?Medication Sig Start Date End Date Taking? Authorizing Provider  ?albuterol (PROVENTIL) (2.5 MG/3ML) 0.083% nebulizer solution Take 3 mLs (2.5 mg total) by nebulization every 6 (six) hours as needed for wheezing or shortness of breath. 06/13/19   Wieters, Hallie C, PA-C  ?albuterol (VENTOLIN HFA) 108 (90 Base) MCG/ACT inhaler Inhale 1-2 puffs into the lungs every 6 (six) hours as needed for wheezing or shortness of breath. 06/13/19   Wieters, Hallie C, PA-C  ?doxycycline (VIBRAMYCIN) 100 MG capsule Take 1 capsule (100 mg total) by mouth 2 (two) times daily. 10/14/21   Hans Eden, NP  ?pantoprazole (PROTONIX) 20 MG tablet Take 1 tablet (20 mg total) by mouth daily. 08/02/21   Lacretia Leigh, MD  ?promethazine-dextromethorphan (PROMETHAZINE-DM) 6.25-15 MG/5ML syrup Take 5 mLs by mouth 4 (four) times daily as needed for cough. 05/01/21   Raspet, Derry Skill, PA-C  ?sucralfate (CARAFATE) 1 g tablet Take 1 tablet (1 g total) by mouth 4 (four) times daily. 08/02/21   Lacretia Leigh, MD  ?citalopram (CELEXA) 10 MG tablet Take 1 tablet (10 mg total) by mouth daily. For mood 08/25/18 03/05/19  Connye Burkitt, NP   ?famotidine (PEPCID) 20 MG tablet Take 1 tablet (20 mg total) by mouth 2 (two) times daily. ?Patient not taking: Reported on 11/06/2019 10/01/19 05/26/20  Darr, Edison Nasuti, PA-C  ?zolpidem (AMBIEN) 5 MG tablet Take 1 tablet (5 mg total) by mouth at bedtime as needed for sleep. 08/24/18 03/05/19  Connye Burkitt, NP  ?   ? ?Allergies    ?Patient has no known allergies.   ? ?Review of Systems   ?Review of Systems ? ?Physical Exam ?Updated Vital Signs ?BP 95/68 (BP Location: Left Arm)   Pulse 76   Temp 98.2 ?F (36.8 ?C) (Oral)   Resp 18   Ht 5\' 1"  (1.549 m)   LMP  (LMP Unknown)   SpO2 100%   BMI 47.24 kg/m?  ?Physical Exam ?Vitals and nursing note reviewed.  ?Constitutional:   ?   Appearance: Normal appearance.  ?HENT:  ?   Head: Normocephalic and atraumatic.  ?Eyes:  ?   General: No scleral icterus. ?   Conjunctiva/sclera: Conjunctivae normal.  ?Pulmonary:  ?   Effort: Pulmonary effort is normal. No respiratory distress.  ?Skin: ?   General: Skin is warm and dry.  ?   Findings: Lesion present. No rash.  ?   Comments: 2.5 cm abscess just under the patient's left breast.  Already with purulent drainage.  Surrounding cellulitic skin.  ?Neurological:  ?   Mental Status: She  is alert.  ?Psychiatric:     ?   Mood and Affect: Mood normal.  ? ? ?ED Results / Procedures / Treatments   ?Labs ?(all labs ordered are listed, but only abnormal results are displayed) ?Labs Reviewed - No data to display ? ?EKG ?None ? ?Radiology ?No results found. ? ?Procedures ?Marland Kitchen.Incision and Drainage ? ?Date/Time: 10/20/2021 3:40 PM ?Performed by: Rhae Hammock, PA-C ?Authorized by: Rhae Hammock, PA-C  ? ?Consent:  ?  Consent obtained:  Verbal ?  Consent given by:  Patient ?  Risks discussed:  Bleeding, incomplete drainage, pain and damage to other organs ?  Alternatives discussed:  No treatment ?Universal protocol:  ?  Procedure explained and questions answered to patient or proxy's satisfaction: yes   ?  Relevant documents present and  verified: yes   ?  Test results available : yes   ?  Imaging studies available: yes   ?  Required blood products, implants, devices, and special equipment available: yes   ?  Site/side marked: yes   ?  Immediately prior to procedure, a time out was called: yes   ?  Patient identity confirmed:  Verbally with patient ?Location:  ?  Type:  Abscess ?  Size:  2.5 ?  Location:  Trunk ?  Trunk location:  Chest ?Pre-procedure details:  ?  Skin preparation:  Povidone-iodine and chlorhexidine with alcohol ?Sedation:  ?  Sedation type:  None ?Anesthesia:  ?  Anesthesia method:  Local infiltration ?  Local anesthetic:  Lidocaine 2% WITH epi ?Procedure type:  ?  Complexity:  Simple ?Procedure details:  ?  Needle aspiration: yes   ?  Needle size:  25 G ?  Incision types:  Single straight ?  Incision depth:  Subcutaneous ?  Wound management:  Probed and deloculated ?  Drainage:  Purulent and serosanguinous ?  Drainage amount:  Moderate ?  Packing materials:  1/2 in iodoform gauze ?Post-procedure details:  ?  Procedure completion:  Tolerated well, no immediate complications  ? ?Medications Ordered in ED ?Medications  ?lidocaine-EPINEPHrine (XYLOCAINE W/EPI) 2 %-1:100000 (with pres) injection 20 mL (has no administration in time range)  ? ? ?ED Course/ Medical Decision Making/ A&P ?  ?                        ?Medical Decision Making ?Risk ?Prescription drug management. ? ? ?21 year old female presenting with abscess.  No known risk factors.  Abscess was already slightly draining however did require further opening.  Moderate amount of purulent fluid drained.  Patient tolerated this very well. ? ?Wound was packed and patient understands she should have it reevaluated 3 days from now.  We also discussed the importance of getting a primary care provider.  She has been referred to community health and wellness. ? ?Final Clinical Impression(s) / ED Diagnoses ?Final diagnoses:  ?Abscess  ? ? ?Rx / DC Orders ?ED Discharge Orders   ? ?       Ordered  ?  cephALEXin (KEFLEX) 500 MG capsule  4 times daily       ? 10/20/21 1542  ? ?  ?  ? ?  ? ?Results and diagnoses were explained to the patient. Return precautions discussed in full. Patient had no additional questions and expressed complete understanding. ? ? ?This chart was dictated using voice recognition software.  Despite best efforts to proofread,  errors can occur which can change the documentation meaning.  ? ?  ?  Rhae Hammock, PA-C ?10/20/21 1545 ? ?  ?Davonna Belling, MD ?10/21/21 0003 ? ?

## 2021-10-20 NOTE — Discharge Instructions (Addendum)
Have your wound checked in 3 days, packing will likely be removed at that time.  Please take all antibiotics as prescribed.  Also, read the information about skin abscesses attached to these discharge papers.   ? ?Please get established with a primary care provider who can check your overall health and assess for any risk factors for this abscess ? ?

## 2021-10-20 NOTE — ED Notes (Signed)
I provided reinforced discharge education based off of discharge instructions. Pt acknowledged and understood my education. Pt had no further questions/concerns for provider/myself.  °

## 2021-10-20 NOTE — ED Triage Notes (Signed)
Patient reports that she has had an abscess approx 3 weeks and has been worsening. Patient went to an UC on 10/14/21 where she was prescribed antibiotics. ?Patient states the abscess drained some on its own last night, but remains painful. ?

## 2021-10-24 ENCOUNTER — Ambulatory Visit (HOSPITAL_COMMUNITY)
Admission: EM | Admit: 2021-10-24 | Discharge: 2021-10-24 | Disposition: A | Discharge status: Home / Self Care | Payer: Medicaid Other

## 2021-10-24 ENCOUNTER — Encounter (HOSPITAL_COMMUNITY): Payer: Self-pay

## 2021-10-24 DIAGNOSIS — Z4800 Encounter for change or removal of nonsurgical wound dressing: Secondary | ICD-10-CM

## 2021-10-24 DIAGNOSIS — N611 Abscess of the breast and nipple: Secondary | ICD-10-CM

## 2021-10-24 NOTE — ED Triage Notes (Signed)
Pt presents for packing removal from abscess on left breast that was drained 2 days ago. ?

## 2021-10-24 NOTE — ED Provider Notes (Signed)
?[facility] ? ? ? ?CSN: [id number] ?Arrival date & time: 10/24/21  1454 ?  ? ?HISTORY  ? ?Chief Complaint  ?Patient presents with  ? Dressing Change  ? ?HPI ?Diane Mann is a 21 y.o. female. Patient presents to have packing removed from abscess on her left breast that was drained 2 days ago.  Patient states the area is not painful. ? ?The history is provided by the patient.  ?Past Medical History:  ?Diagnosis Date  ? Anxiety   ? Asthma   ? Asthma 04/24/2015  ? 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 Congenital heart problem   ? blockage  ? Headache   ? Obesity   ? Panic attacks 04/24/2015  ? RBBB (right bundle branch block) 04/24/2015  ? Schizoaffective disorder (HCC)   ? Social anxiety disorder 04/24/2015  ? ?Patient Active Problem List  ? Diagnosis Date Noted  ? Major depressive disorder, recurrent episode, severe (HCC) 08/19/2018  ? 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 Social anxiety disorder 04/24/2015  ? Panic attacks 04/24/2015  ? RBBB (right bundle branch block) 04/24/2015  ? 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 Asthma 04/24/2015  ? Oligomenorrhea 04/09/2015  ? Thyroid function test abnormal 08/05/2014  ? 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 Obesity 08/05/2014  ? Short stature 08/05/2014  ? ?Past Surgical History:  ?Procedure Laterality Date  ? MOUTH SURGERY  05/04/2017  ? Wisdom teeth ectraction Bilateral   ? ?OB History   ?No obstetric history on file. ?  ? ?Home Medications   ? ?Prior to Admission medications   ?Medication Sig Start Date End Date Taking? Authorizing Provider  ?albuterol (PROVENTIL) (2.5 MG/3ML) 0.083% nebulizer solution Take 3 mLs (2.5 mg total) by nebulization every 6 (six) hours as needed for wheezing or shortness of breath. 06/13/19   Wieters, Hallie C, PA-C  ?albuterol (VENTOLIN HFA) 108 (90 Base) MCG/ACT inhaler Inhale 1-2 puffs into the lungs every 6 (six) hours as needed for wheezing or shortness of  breath. 06/13/19   Wieters, Junius Creamer, PA-C  ?cephALEXin (KEFLEX) 500 MG capsule Take 1 capsule (500 mg total) by mouth 4 (four) times daily for 5 days. 10/20/21 10/25/21  Redwine, Madison A, PA-C  ?doxycycline (VIBRAMYCIN) 100 MG capsule Take 1 capsule (100 mg total) by mouth 2 (two) times daily. 10/14/21   Valinda Hoar, NP  ?pantoprazole (PROTONIX) 20 MG tablet Take 1 tablet (20 mg total) by mouth daily. 08/02/21   Lorre Nick, MD  ?promethazine-dextromethorphan (PROMETHAZINE-DM) 6.25-15 MG/5ML syrup Take 5 mLs by mouth 4 (four) times daily as needed for cough. 05/01/21   Raspet, Noberto Retort, PA-C  ?sucralfate (CARAFATE) 1 g tablet Take 1 tablet (1 g total) by mouth 4 (four) times daily. 08/02/21   Lorre Nick, MD  ?citalopram (CELEXA) 10 MG tablet Take 1 tablet (10 mg total) by mouth daily. For mood 08/25/18 03/05/19  Aldean Baker, NP  ?famotidine (PEPCID) 20 MG tablet Take 1 tablet (20 mg total) by mouth 2 (two) times daily. ?Patient not taking: Reported on 11/06/2019 10/01/19 05/26/20  Darr, Gerilyn Pilgrim, PA-C  ?zolpidem (AMBIEN) 5 MG tablet Take 1 tablet (5 mg total) by mouth at bedtime as needed for sleep. 08/24/18 03/05/19  Aldean Baker, NP  ? ? ?Family History ?Family History  ?Problem Relation Age of Onset  ? Lupus Mother   ? Heart disease Father   ? ?Social History ?Social History  ? ?  Tobacco Use  ? Smoking status: Every Day  ?  Packs/day: 0.20  ?  Types: Cigarettes  ? Smokeless tobacco: Never  ? Tobacco comments:  ?   5 perday  ?Vaping Use  ? Vaping Use: Never used  ?Substance Use Topics  ? Alcohol use: No  ? Drug use: No  ? ?Allergies   ?Patient has no known allergies. ? ?Review of Systems ?Review of Systems ?Pertinent findings noted in history of present illness.  ? ?Physical Exam ?Triage Vital Signs ?ED Triage Vitals  ?Enc Vitals Group  ?   BP 04/24/21 0827 (!) 147/82  ?   Pulse Rate 04/24/21 0827 72  ?   Resp 04/24/21 0827 18  ?   Temp 04/24/21 0827 98.3 ?F (36.8 ?C)  ?   Temp Source 04/24/21 0827 Oral  ?   SpO2  04/24/21 0827 98 %  ?   Weight --   ?   Height --   ?   Head Circumference --   ?   Peak Flow --   ?   Pain Score 04/24/21 0826 5  ?   Pain Loc --   ?   Pain Edu? --   ?   Excl. in GC? --   ?No data found. ? ?Updated Vital Signs ?BP 139/84 (BP Location: Right Arm)   Pulse 74   Temp 98.9 ?F (37.2 ?C) (Oral)   Resp 18   LMP  (LMP Unknown)   SpO2 95%  ? ?Physical Exam ?Vitals and nursing note reviewed.  ?Constitutional:   ?   General: She is not in acute distress. ?   Appearance: Normal appearance. She is not ill-appearing.  ?HENT:  ?   Head: Normocephalic and atraumatic.  ?Eyes:  ?   General: Lids are normal.     ?   Right eye: No discharge.     ?   Left eye: No discharge.  ?   Extraocular Movements: Extraocular movements intact.  ?   Conjunctiva/sclera: Conjunctivae normal.  ?   Right eye: Right conjunctiva is not injected.  ?   Left eye: Left conjunctiva is not injected.  ?Neck:  ?   Trachea: Trachea and phonation normal.  ?Cardiovascular:  ?   Rate and Rhythm: Normal rate and regular rhythm.  ?   Pulses: Normal pulses.  ?   Heart sounds: Normal heart sounds. No murmur heard. ?  No friction rub. No gallop.  ?Pulmonary:  ?   Effort: Pulmonary effort is normal. No accessory muscle usage, prolonged expiration or respiratory distress.  ?   Breath sounds: Normal breath sounds. No stridor, decreased air movement or transmitted upper airway sounds. No decreased breath sounds, wheezing, rhonchi or rales.  ?Chest:  ?   Chest wall: No tenderness.  ? ? ?Musculoskeletal:     ?   General: Normal range of motion.  ?   Cervical back: Normal range of motion and neck supple. Normal range of motion.  ?Lymphadenopathy:  ?   Cervical: No cervical adenopathy.  ?Skin: ?   General: Skin is warm and dry.  ?   Findings: No erythema or rash.  ?Neurological:  ?   General: No focal deficit present.  ?   Mental Status: She is alert and oriented to person, place, and time.  ?Psychiatric:     ?   Mood and Affect: Mood normal.     ?    Behavior: Behavior normal.  ? ? ?Visual Acuity ?Right Eye Distance:   ?  Left Eye Distance:   ?Bilateral Distance:   ? ?Right Eye Near:   ?Left Eye Near:    ?Bilateral Near:    ? ?UC Couse / Diagnostics / Procedures:  ?  ?EKG ? ?Radiology ?No results found. ? ?Procedures ?Procedures (including critical care time) ? ?UC Diagnoses / Final Clinical Impressions(s)   ?I have reviewed the triage vital signs and the nursing notes. ? ?Pertinent labs & imaging results that were available during my care of the patient were reviewed by me and considered in my medical decision making (see chart for details).   ? ?Final diagnoses:  ?Abscess of breast  ?Dressing change  ? ?Packing was removed without incident.  Return precautions advised. ? ?ED Prescriptions   ?None ?  ? ?PDMP not reviewed this encounter. ? ?Pending results:  ?Labs Reviewed - No data to display ? ?Medications Ordered in UC: ?Medications - No data to display ? ?Disposition Upon Discharge:  ?Condition: stable for discharge home ?Home: take medications as prescribed; routine discharge instructions as discussed; follow up as advised. ? ?Patient presented with an acute illness with associated systemic symptoms and significant discomfort requiring urgent management. In my opinion, this is a condition that a prudent lay person (someone who possesses an average knowledge of health and medicine) may potentially expect to result in complications if not addressed urgently such as respiratory distress, impairment of bodily function or dysfunction of bodily organs.  ? ?Routine symptom specific, illness specific and/or disease specific instructions were discussed with the patient and/or caregiver at length.  ? ?As such, the patient has been evaluated and assessed, work-up was performed and treatment was provided in alignment with urgent care protocols and evidence based medicine.  Patient/parent/caregiver has been advised that the patient may require follow up for further  testing and treatment if the symptoms continue in spite of treatment, as clinically indicated and appropriate. ? ?Patient/parent/caregiver has been advised to return to the [facility] or PCP if no better; to PCP or the Emergency [facility]

## 2021-10-24 NOTE — Discharge Instructions (Addendum)
Continue daily dressing changes with Vaseline gauze, nonstick pad and Medipore tape.  Please monitor the area for redness, purulent drainage, unusual odor or increased pain.  If any of these occur, please return for repeat evaluation.  Please finish all of your antibiotics as prescribed. ?

## 2022-02-02 ENCOUNTER — Other Ambulatory Visit: Payer: Self-pay

## 2022-02-02 DIAGNOSIS — Y92410 Unspecified street and highway as the place of occurrence of the external cause: Secondary | ICD-10-CM | POA: Diagnosis not present

## 2022-02-02 DIAGNOSIS — R079 Chest pain, unspecified: Secondary | ICD-10-CM | POA: Insufficient documentation

## 2022-02-02 DIAGNOSIS — Z5321 Procedure and treatment not carried out due to patient leaving prior to being seen by health care provider: Secondary | ICD-10-CM | POA: Insufficient documentation

## 2022-02-03 ENCOUNTER — Encounter (HOSPITAL_COMMUNITY): Payer: Self-pay

## 2022-02-03 ENCOUNTER — Emergency Department (HOSPITAL_COMMUNITY)
Admission: EM | Admit: 2022-02-03 | Discharge: 2022-02-03 | Disposition: A | Discharge status: Home / Self Care | Payer: Medicaid Other | Attending: Emergency Medicine | Admitting: Emergency Medicine

## 2022-02-03 ENCOUNTER — Ambulatory Visit (INDEPENDENT_AMBULATORY_CARE_PROVIDER_SITE_OTHER): Payer: Medicaid Other

## 2022-02-03 ENCOUNTER — Ambulatory Visit (HOSPITAL_COMMUNITY)
Admission: EM | Admit: 2022-02-03 | Discharge: 2022-02-03 | Disposition: A | Discharge status: Home / Self Care | Payer: Medicaid Other | Attending: Sports Medicine | Admitting: Sports Medicine

## 2022-02-03 DIAGNOSIS — S161XXA Strain of muscle, fascia and tendon at neck level, initial encounter: Secondary | ICD-10-CM | POA: Diagnosis not present

## 2022-02-03 DIAGNOSIS — M7918 Myalgia, other site: Secondary | ICD-10-CM

## 2022-02-03 DIAGNOSIS — M545 Low back pain, unspecified: Secondary | ICD-10-CM | POA: Diagnosis not present

## 2022-02-03 LAB — CBC
HCT: 35.1 % — ABNORMAL LOW (ref 36.0–46.0)
Hemoglobin: 11.6 g/dL — ABNORMAL LOW (ref 12.0–15.0)
MCH: 32.5 pg (ref 26.0–34.0)
MCHC: 33 g/dL (ref 30.0–36.0)
MCV: 98.3 fL (ref 80.0–100.0)
Platelets: 270 10*3/uL (ref 150–400)
RBC: 3.57 MIL/uL — ABNORMAL LOW (ref 3.87–5.11)
RDW: 12.5 % (ref 11.5–15.5)
WBC: 11.7 10*3/uL — ABNORMAL HIGH (ref 4.0–10.5)
nRBC: 0 % (ref 0.0–0.2)

## 2022-02-03 LAB — COMPREHENSIVE METABOLIC PANEL
ALT: 17 U/L (ref 0–44)
AST: 26 U/L (ref 15–41)
Albumin: 4 g/dL (ref 3.5–5.0)
Alkaline Phosphatase: 44 U/L (ref 38–126)
Anion gap: 11 (ref 5–15)
BUN: 13 mg/dL (ref 6–20)
CO2: 24 mmol/L (ref 22–32)
Calcium: 9.4 mg/dL (ref 8.9–10.3)
Chloride: 107 mmol/L (ref 98–111)
Creatinine, Ser: 0.99 mg/dL (ref 0.44–1.00)
GFR, Estimated: >60 mL/min (ref >60)
Glucose, Bld: 94 mg/dL (ref 70–99)
Potassium: 3.7 mmol/L (ref 3.5–5.1)
Sodium: 142 mmol/L (ref 135–145)
Total Bilirubin: 0.5 mg/dL (ref 0.3–1.2)
Total Protein: 7.4 g/dL (ref 6.5–8.1)

## 2022-02-03 LAB — I-STAT BETA HCG BLOOD, ED (MC, WL, AP ONLY): I-stat hCG, quantitative: <5 m[IU]/mL (ref <5)

## 2022-02-03 MED ORDER — METHYLPREDNISOLONE SODIUM SUCC 125 MG IJ SOLR
INTRAMUSCULAR | Status: AC
  Filled 2022-02-03: qty 2

## 2022-02-03 MED ORDER — METHYLPREDNISOLONE SODIUM SUCC 125 MG IJ SOLR
80.0000 mg | Freq: Once | INTRAMUSCULAR | Status: AC
  Administered 2022-02-03: 80 mg via INTRAMUSCULAR

## 2022-02-03 MED ORDER — CYCLOBENZAPRINE HCL 10 MG PO TABS: 10.0000 mg | ORAL_TABLET | Freq: Two times a day (BID) | ORAL | 0 refills | Status: DC | PRN

## 2022-02-03 MED ORDER — KETOROLAC TROMETHAMINE 60 MG/2ML IM SOLN
60.0000 mg | Freq: Once | INTRAMUSCULAR | Status: AC
  Administered 2022-02-03: 60 mg via INTRAMUSCULAR

## 2022-02-03 MED ORDER — KETOROLAC TROMETHAMINE 60 MG/2ML IM SOLN
INTRAMUSCULAR | Status: AC
  Filled 2022-02-03: qty 2
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
## 2022-02-03 NOTE — ED Notes (Signed)
Pt stated she is going to urgent care and would like to be taken out the system
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
## 2022-02-03 NOTE — Discharge Instructions (Signed)
All of your x rays were negative You will still probably be sore for a few more days Take muscle relaxer as needed for muscle spasms Take Ibuprofen and Tylenol as needed
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
## 2022-02-03 NOTE — ED Notes (Signed)
Pt seen outside at this time, will update vitals once back in waiting room

## 2023-04-25 ENCOUNTER — Ambulatory Visit (INDEPENDENT_AMBULATORY_CARE_PROVIDER_SITE_OTHER): Payer: Self-pay

## 2023-04-25 ENCOUNTER — Ambulatory Visit (HOSPITAL_COMMUNITY)
Admission: EM | Admit: 2023-04-25 | Discharge: 2023-04-25 | Disposition: A | Discharge status: Home / Self Care | Payer: Self-pay | Attending: Physician Assistant | Admitting: Physician Assistant

## 2023-04-25 ENCOUNTER — Encounter (HOSPITAL_COMMUNITY): Payer: Self-pay

## 2023-04-25 DIAGNOSIS — J189 Pneumonia, unspecified organism: Secondary | ICD-10-CM

## 2023-04-25 DIAGNOSIS — J4541 Moderate persistent asthma with (acute) exacerbation: Secondary | ICD-10-CM

## 2023-04-25 MED ORDER — AZITHROMYCIN 250 MG PO TABS: 250.0000 mg | ORAL_TABLET | Freq: Every day | ORAL | 0 refills | Status: DC

## 2023-04-25 MED ORDER — PREDNISONE 20 MG PO TABS: 40.0000 mg | ORAL_TABLET | Freq: Every day | ORAL | 0 refills | Status: AC

## 2023-04-25 MED ORDER — AMOXICILLIN-POT CLAVULANATE 875-125 MG PO TABS: 1.0000 | ORAL_TABLET | Freq: Two times a day (BID) | ORAL | 0 refills | Status: DC

## 2023-04-25 NOTE — ED Triage Notes (Signed)
Patient here today with c/o cough, SOB, wheeze, congestion, nasal drainage, bilat ear pain, headache, and slight fever in the beginning X 1 week. She uses an inhaler and a nebulizer treatment with some relief. She also Tylenol and Nyquil with some relief.

## 2023-04-25 NOTE — ED Provider Notes (Signed)
[facility]    CSN: [id number] Arrival date & time: 04/25/23  1701      History   Chief Complaint Chief Complaint  Patient presents with   Cough    HPI Diane Mann is a 22 y.o. female.   Patient presents today with a 1 week history of URI symptoms.  She reports cough, shortness of breath, wheezing, bilateral otalgia, headache, subjective fever.  She denies any chest pain, shortness of breath, nausea, vomiting.  She does have a history of asthma and has been using her albuterol inhaler and nebulizer with super improvement of symptoms.  She is also tried Tylenol and NyQuil without improvement.  Denies any recent antibiotics or steroids.  She has no concern for pregnancy.  She denies any history of diabetes.  She does report being hospitalized for her asthma when she was a child but not recently.  She has had COVID several years ago.  She had initial COVID-19 vaccination but not most recent booster.  She denies any known sick contacts.    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      Home Medications    Prior to Admission medications   Medication Sig Start Date End Date Taking? Authorizing Provider  amoxicillin-clavulanate (AUGMENTIN) 875-125 MG tablet Take 1 tablet by mouth every 12 (twelve) hours. 04/25/23  Yes Brina Umeda K, PA-C  azithromycin (ZITHROMAX) 250 MG tablet Take 1 tablet (250 mg total) by mouth daily. Take first 2 tablets together, then 1 every day until finished. 04/25/23  Yes Printice Hellmer K, PA-C  predniSONE (DELTASONE) 20 MG tablet Take 2 tablets (40 mg total) by mouth daily for 5 days. 04/25/23 04/30/23 Yes Vic Esco K, PA-C  albuterol (PROVENTIL) (2.5 MG/3ML) 0.083% nebulizer solution Take 3 mLs (2.5 mg total) by nebulization every 6 (six) hours as needed for wheezing or shortness of breath. 06/13/19   Wieters, Hallie C, PA-C  albuterol (VENTOLIN HFA) 108 (90 Base) MCG/ACT inhaler Inhale 1-2 puffs into the lungs every 6 (six) hours as needed for wheezing or shortness of breath. 06/13/19   Wieters, Hallie C, PA-C  citalopram (CELEXA) 10 MG tablet Take 1 tablet (10 mg total) by mouth daily. For mood 08/25/18 03/05/19  Aldean Baker, NP  famotidine (PEPCID) 20 MG tablet Take 1 tablet (20 mg total) by mouth 2 (two) times daily. Patient not taking:  Reported on 11/06/2019 10/01/19 05/26/20  Darr, Gerilyn Pilgrim, PA-C  zolpidem (AMBIEN) 5 MG tablet Take 1 tablet (5 mg total) by mouth at bedtime as needed for sleep. 08/24/18 03/05/19  Aldean Baker, NP    Family History Family History  Problem Relation Age of Onset   Lupus Mother    Heart disease Father     Social History Social History   Tobacco Use   Smoking status: Every Day    Current packs/day: 0.20    Types: Cigarettes   Smokeless tobacco: Never   Tobacco comments:     5 perday  Vaping Use   Vaping status: Never Used  Substance Use Topics   Alcohol use: No   Drug use: No      Allergies   Patient has no known allergies.   Review of Systems Review of Systems  Constitutional:  Positive for activity change, fatigue and fever. Negative for appetite change.  HENT:  Positive for congestion and ear pain. Negative for sinus pressure, sneezing and sore throat.   Respiratory:  Positive for cough, shortness of breath and wheezing. Negative for chest tightness.   Cardiovascular:  Negative for chest pain.  Gastrointestinal:  Negative for abdominal pain, diarrhea, nausea and vomiting.  Musculoskeletal:  Negative for arthralgias and myalgias.  Neurological:  Positive for headaches. Negative for dizziness and light-headedness.     Physical Exam Triage Vital Signs ED Triage Vitals  Encounter Vitals Group     BP 04/25/23 1819 113/71     Systolic BP Percentile --      Diastolic BP Percentile --      Pulse Rate 04/25/23 1819 82     Resp 04/25/23 1819 18     Temp 04/25/23 1819 98.9 F (37.2 C)     Temp Source 04/25/23 1819 Oral     SpO2 04/25/23 1819 94 %     Weight --      Height 04/25/23 1819 5\' 1"  (1.549 m)     Head Circumference --      Peak Flow --      Pain Score 04/25/23 1818 0     Pain Loc --      Pain Education --      Exclude from Growth Chart --    No data found.  Updated Vital Signs BP 113/71 (BP Location: Left Arm)   Pulse 82   Temp 98.9 F (37.2 C) (Oral)   Resp 18   Ht 5\' 1"  (1.549 m)   LMP  (LMP Unknown)   SpO2 94%   BMI 44.78 kg/m   Visual Acuity Right Eye Distance:   Left Eye Distance:   Bilateral Distance:    Right Eye Near:   Left Eye Near:    Bilateral Near:     Physical Exam Vitals reviewed.  Constitutional:      General: She is awake. She is not in acute distress.    Appearance: Normal appearance. She is well-developed. She is not ill-appearing.     Comments: Very pleasant female appears stated age in no acute distress sitting comfortably in exam room  HENT:     Head: Normocephalic and atraumatic.     Right  Ear: Ear canal and external ear normal. A middle ear effusion is present. Tympanic membrane is not erythematous or bulging.     Left Ear: Ear canal and external ear normal. A middle ear effusion is present. Tympanic membrane is not erythematous or bulging.     Mouth/Throat:  Pharynx: Uvula midline. Postnasal drip present. No oropharyngeal exudate or posterior oropharyngeal erythema.  Cardiovascular:     Rate and Rhythm: Normal rate and regular rhythm.     Heart sounds: Normal heart sounds, S1 normal and S2 normal. No murmur heard. Pulmonary:     Effort: Pulmonary effort is normal.     Breath sounds: Examination of the left-lower field reveals rales. Rales present. No wheezing or rhonchi.  Psychiatric:        Behavior: Behavior is cooperative.      UC Treatments / Results  Labs (all labs ordered are listed, but only abnormal results are displayed) Labs Reviewed - No data to display  EKG   Radiology No results found.  Procedures Procedures (including critical care time)  Medications Ordered in UC Medications - No data to display  Initial Impression / Assessment and Plan / UC Course  I have reviewed the triage vital signs and the nursing notes.  Pertinent labs & imaging results that were available during my care of the patient were reviewed by me and considered in my medical decision making (see chart for details).     Patient is well-appearing, afebrile, nontoxic, nontachycardic.  Viral testing was deferred as she has been symptomatic for over a week and this would not change our management.  She did have rales on left chest x-ray was obtained that did show potential infiltrate in left lower lobe.  Will empirically treat for CAP with Augmentin and azithromycin.  No indication for hospitalization based on curb 65 score of 0.  At the time of discharge we were waiting for radiologist over read and we will contact her if this is different and changes our treatment plan.  I am  also concerned for an asthma exacerbation given her increased use of albuterol.  She has plenty of this medication at home and was encouraged to continue using as needed we will also start prednisone burst of 40 mg for 5 days.  We discussed that she should not take NSAIDs with this medication but can use over-the-counter medication including Mucinex, Flonase, Tylenol.  She is to rest and drink plenty of fluid.  Recommend she follow-up with her primary care next week for reevaluation.  Discussed that if anything worsens or changes she needs to be seen immediately.  Strict return precautions given.  Work excuse note provided.  Final Clinical Impressions(s) / UC Diagnoses   Final diagnoses:  Pneumonia of left lower lobe due to infectious organism  Moderate persistent asthma with acute exacerbation     Discharge Instructions      Based on your x-ray I am concerned that you are developing pneumonia on the left side.  Start Augmentin twice daily for 7 days and azithromycin as prescribed.  I am also aware that you have an asthma exacerbation.  Continue your albuterol as previously recommended and use prednisone 40 mg for 5 days.  Do not take NSAIDs with this medication including aspirin, ibuprofen/Advil, naproxen/Aleve.  Use over-the-counter medications including Mucinex, Flonase, Tylenol for additional symptom relief.  Follow-up with your primary care within a week for recheck.  If anything worsens and you have high fever, worsening cough, shortness of breath, chest pain, weakness, nausea, vomiting you need to go to the emergency room.     ED Prescriptions     Medication Sig Dispense Auth. Provider   amoxicillin-clavulanate (AUGMENTIN) 875-125 MG tablet Take 1 tablet by mouth every 12 (twelve) hours. 14 tablet Kalid Ghan K, PA-C   azithromycin (  ZITHROMAX) 250 MG tablet Take 1 tablet (250 mg total) by mouth daily. Take first 2 tablets together, then 1 every day until finished. 6 tablet Carrine Kroboth  K, PA-C   predniSONE (DELTASONE) 20 MG tablet Take 2 tablets (40 mg total) by mouth daily for 5 days. 10 tablet Haig Gerardo, Noberto Retort, PA-C      PDMP not reviewed this encounter.   Jeani Hawking, PA-C 04/25/23 1915

## 2023-04-25 NOTE — Discharge Instructions (Signed)
Based on your x-ray I am concerned that you are developing pneumonia on the left side.  Start Augmentin twice daily for 7 days and azithromycin as prescribed.  I am also aware that you have an asthma exacerbation.  Continue your albuterol as previously recommended and use prednisone 40 mg for 5 days.  Do not take NSAIDs with this medication including aspirin, ibuprofen/Advil, naproxen/Aleve.  Use over-the-counter medications including Mucinex, Flonase, Tylenol for additional symptom relief.  Follow-up with your primary care within a week for recheck.  If anything worsens and you have high fever, worsening cough, shortness of breath, chest pain, weakness, nausea, vomiting you need to go to the emergency room.

## 2023-07-16 ENCOUNTER — Ambulatory Visit
Admission: EM | Admit: 2023-07-16 | Discharge: 2023-07-16 | Disposition: A | Discharge status: Home / Self Care | Payer: Self-pay

## 2023-07-16 ENCOUNTER — Ambulatory Visit (INDEPENDENT_AMBULATORY_CARE_PROVIDER_SITE_OTHER): Payer: Self-pay

## 2023-07-16 DIAGNOSIS — J4541 Moderate persistent asthma with (acute) exacerbation: Secondary | ICD-10-CM

## 2023-07-16 MED ORDER — ALBUTEROL SULFATE HFA 108 (90 BASE) MCG/ACT IN AERS: 1.0000 | INHALATION_SPRAY | Freq: Four times a day (QID) | RESPIRATORY_TRACT | 0 refills | Status: AC | PRN

## 2023-07-16 MED ORDER — BUDESONIDE-FORMOTEROL FUMARATE 80-4.5 MCG/ACT IN AERO: 2.0000 | INHALATION_SPRAY | Freq: Two times a day (BID) | RESPIRATORY_TRACT | 1 refills | Status: AC

## 2023-07-16 MED ORDER — METHYLPREDNISOLONE ACETATE 80 MG/ML IJ SUSP
60.0000 mg | Freq: Once | INTRAMUSCULAR | Status: AC
  Administered 2023-07-16: 60 mg via INTRAMUSCULAR

## 2023-07-16 MED ORDER — PREDNISONE 20 MG PO TABS: 40.0000 mg | ORAL_TABLET | Freq: Every day | ORAL | 0 refills | Status: AC

## 2023-07-16 MED ORDER — ALBUTEROL SULFATE (2.5 MG/3ML) 0.083% IN NEBU: 2.5000 mg | INHALATION_SOLUTION | Freq: Four times a day (QID) | RESPIRATORY_TRACT | 0 refills | Status: AC | PRN

## 2023-07-16 MED ORDER — ALBUTEROL SULFATE (2.5 MG/3ML) 0.083% IN NEBU
2.5000 mg | INHALATION_SOLUTION | Freq: Once | RESPIRATORY_TRACT | Status: AC
  Administered 2023-07-16: 2.5 mg via RESPIRATORY_TRACT

## 2023-07-16 NOTE — ED Provider Notes (Signed)
[facility]    CSN: [id number] Arrival date & time: 07/16/23  1320      History   Chief Complaint Chief Complaint  Patient presents with   Nasal Congestion   Asthma Exacerbation    HPI Diane Mann is a 23 y.o. female.   Patient presents today with a weeklong history of worsening cough.  She reports associated congestion, shortness of breath, chest tightness, wheezing.  She denies any chest pain, nausea, vomiting.  She has a history of asthma was treated for an asthma exacerbation several months ago.  She reports hospitalization when she was a child but does not been hospitalized recently for asthma.  She was treated for pneumonia and asthma exacerbation with Augmentin, azithromycin, prednisone in October 2024 but denies additional antibiotics or steroids in the past 90 days.  She is no concern for pregnancy.  She has had COVID several years ago.  She has had COVID-19 vaccinations.  She is having difficulty with her daily activities as a result of symptoms.  She feels she cannot sleep at night because of the severity of cough.    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   Congenital heart problem    blockage   Headache    Obesity    Panic attacks 04/24/2015   RBBB (right bundle branch block) 04/24/2015   Schizoaffective disorder (HCC)    Social anxiety disorder 04/24/2015    Patient Active Problem List   Diagnosis Date Noted   Major depressive disorder, recurrent episode, severe (HCC) 08/19/2018   Sinus pause 07/13/2017   Palpitations 05/06/2017   Postural dizziness with presyncope 05/06/2017   Severe obesity with body mass index (BMI) greater than 99th percentile for age in childhood (HCC) 05/19/2016   First degree AV block 05/06/2015   Social anxiety disorder 04/24/2015   Panic attacks 04/24/2015   RBBB (right bundle branch block) 04/24/2015   AV block, 1st degree 04/24/2015    Asthma 04/24/2015   Oligomenorrhea 04/09/2015   Thyroid function test abnormal 08/05/2014   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      Home Medications    Prior to Admission medications   Medication Sig Start Date End Date Taking? Authorizing Provider  budesonide-formoterol (SYMBICORT) 80-4.5 MCG/ACT inhaler Inhale 2 puffs into the lungs in the morning and at bedtime. 07/16/23  Yes Reshanda Lewey K, PA-C  Chlorphen-Phenyleph-ASA (ALKA-SELTZER PLUS COLD PO) Take by mouth.   Yes [provider]  guaiFENesin (MUCINEX PO) Take by mouth.   Yes [provider]  predniSONE (DELTASONE) 20 MG tablet Take 2 tablets (40 mg total) by mouth daily for 5 days. 07/16/23 07/21/23 Yes Georgean Spainhower, Noberto Retort, PA-C  acetaminophen (TYLENOL) 500 MG tablet Take 500 mg by mouth every 8 (eight) hours as needed. 09/13/18   [provider]  albuterol (PROVENTIL) (2.5 MG/3ML) 0.083% nebulizer solution Take 3 mLs (2.5 mg total) by nebulization every 6 (six) hours as needed for wheezing or shortness of breath. 07/16/23   Elaine Middleton, Noberto Retort, PA-C  albuterol (VENTOLIN HFA) 108 (90 Base) MCG/ACT inhaler Inhale 1-2 puffs into the lungs every  6 (six) hours as needed for wheezing or shortness of breath. 07/16/23   Lasean Rahming, Noberto Retort, PA-C  ibuprofen (ADVIL) 800 MG tablet Take 800 mg by mouth every 8 (eight) hours as needed.    [provider]  meloxicam (MOBIC) 7.5 MG tablet Take 7.5 mg by mouth daily.    [provider]  metFORMIN (GLUCOPHAGE) 500 MG tablet Take 500 mg by mouth daily with breakfast. 08/15/17   [provider]  norethindrone-ethinyl estradiol-iron (LOESTRIN FE 1.5/30) 1.5-30 MG-MCG tablet Take 1 tablet by mouth daily. 03/30/17    [provider]  citalopram (CELEXA) 10 MG tablet Take 1 tablet (10 mg total) by mouth daily. For mood 08/25/18 03/05/19  Aldean Baker, NP  famotidine (PEPCID) 20 MG tablet Take 1 tablet (20 mg total) by mouth 2 (two) times daily. Patient not taking: Reported on 11/06/2019 10/01/19 05/26/20  Darr, Gerilyn Pilgrim, PA-C  zolpidem (AMBIEN) 5 MG tablet Take 1 tablet (5 mg total) by mouth at bedtime as needed for sleep. 08/24/18 03/05/19  Aldean Baker, NP    Family History Family History  Problem Relation Age of Onset   Lupus Mother    Heart disease Father     Social History Social History   Tobacco Use   Smoking status: Every Day    Current packs/day: 0.20    Types: Cigarettes   Smokeless tobacco: Never   Tobacco comments:     5 perday  Vaping Use   Vaping status: Never Used  Substance Use Topics   Alcohol use: No   Drug use: No     Allergies   Patient has no known allergies.   Review of Systems Review of Systems  Constitutional:  Positive for activity change and fatigue. Negative for appetite change and fever.  HENT:  Positive for congestion, postnasal drip and sore throat. Negative for sinus pressure and sneezing.   Respiratory:  Positive for cough, chest tightness, shortness of breath and wheezing.   Cardiovascular:  Negative for chest pain.  Gastrointestinal:  Negative for abdominal pain, diarrhea, nausea and vomiting.  Neurological:  Negative for dizziness, light-headedness and headaches.     Physical Exam Triage Vital Signs ED Triage Vitals  Encounter Vitals Group     BP 07/16/23 1327 134/84     Systolic BP Percentile --      Diastolic BP Percentile --      Pulse Rate 07/16/23 1327 87     Resp 07/16/23 1327 (!) 22     Temp 07/16/23 1327 98.2 F (36.8 C)     Temp Source 07/16/23 1327 Oral     SpO2 07/16/23 1327 94 %     Weight 07/16/23 1331 200 lb (90.7 kg)     Height 07/16/23 1331 5\' 4"  (1.626 m)     Head Circumference --      Peak Flow --      Pain Score  07/16/23 1331 2     Pain Loc --      Pain Education --      Exclude from Growth Chart --    No data found.  Updated Vital Signs BP 134/84 (BP Location: Left Arm)   Pulse 83   Temp 98.2 F (36.8 C) (Oral)   Resp (!) 22   Ht 5\' 4"  (1.626 m)   Wt 200 lb (90.7 kg)   LMP  (LMP Unknown)   SpO2 95%   BMI 34.33 kg/m   Visual Acuity Right Eye Distance:   Left  Eye Distance:   Bilateral Distance:    Right Eye Near:   Left Eye Near:    Bilateral Near:     Physical Exam Vitals reviewed.  Constitutional:      General: She is awake. She is not in acute distress.    Appearance: Normal appearance. She is well-developed. She is not ill-appearing.     Comments: Very pleasant female appears stated age in no acute distress sitting comfortably in exam room  HENT:     Head: Normocephalic and atraumatic.     Right Ear: Tympanic membrane, ear canal and external ear normal. Tympanic membrane is not erythematous or bulging.     Left Ear: Tympanic membrane, ear canal and external ear normal. Tympanic membrane is not erythematous or bulging.     Nose:     Right Sinus: No maxillary sinus tenderness or frontal sinus tenderness.     Left Sinus: No maxillary sinus tenderness or frontal sinus tenderness.     Mouth/Throat:     Pharynx: Uvula midline. No oropharyngeal exudate or posterior oropharyngeal erythema.  Cardiovascular:     Rate and Rhythm: Normal rate and regular rhythm.     Heart sounds: Normal heart sounds, S1 normal and S2 normal. No murmur heard. Pulmonary:     Effort: Pulmonary effort is normal.     Breath sounds: Wheezing present. No rhonchi or rales.     Comments: Widespread wheezing Psychiatric:        Behavior: Behavior is cooperative.      UC Treatments / Results  Labs (all labs ordered are listed, but only abnormal results are displayed) Labs Reviewed - No data to display  EKG   Radiology DG Chest 2 View Result Date: 07/16/2023 CLINICAL DATA:  Progressive cough.   Pneumonia several months ago. EXAM: CHEST - 2 VIEW COMPARISON:  04/25/2023 FINDINGS: The heart size and mediastinal contours are within normal limits. Both lungs are clear. The visualized skeletal structures are unremarkable. IMPRESSION: No active cardiopulmonary disease. Electronically Signed   By: Signa Kell M.D.   On: 07/16/2023 14:49    Procedures Procedures (including critical care time)  Medications Ordered in UC Medications  albuterol (PROVENTIL) (2.5 MG/3ML) 0.083% nebulizer solution 2.5 mg (2.5 mg Nebulization Given 07/16/23 1352)  methylPREDNISolone acetate (DEPO-MEDROL) injection 60 mg (60 mg Intramuscular Given 07/16/23 1352)    Initial Impression / Assessment and Plan / UC Course  I have reviewed the triage vital signs and the nursing notes.  Pertinent labs & imaging results that were available during my care of the patient were reviewed by me and considered in my medical decision making (see chart for details).     Patient is well-appearing, afebrile, nontoxic, nontachycardic.  Viral testing was deferred as patient has been symptomatic for a week and this would not change management.  No evidence of acute infection on physical exam that would warrant initiation of antibiotics.  Chest x-ray was obtained that showed no acute cardiopulmonary disease.  She did have significant wheezing and rhonchi on exam so was given albuterol and Depo-Medrol in clinic with improvement of symptoms.  She was prescribed albuterol inhaler as well as nebulizer solution to be used every 4-6 hours as needed for shortness of breath and coughing fits.  She was started on Symbicort given her recurrent symptoms and we discussed that she is to rinse her mouth following use of this medication to prevent thrush.  Will start prednisone tomorrow given Depo-Medrol injection today (07/17/2023).  She is not to  use NSAIDs with this medication and risk of GI bleeding.  She can use Mucinex, Tylenol, Flonase for additional  symptom relief.  We discussed that if anything worsens and she has increasing cough, shortness of breath, chest pain, wheezing, chest tightness despite medication she should be seen immediately.  Strict return precautions given.  Work excuse note provided.  Final Clinical Impressions(s) / UC Diagnoses   Final diagnoses:  Moderate persistent asthma with acute exacerbation     Discharge Instructions      Your chest x-ray was normal with no evidence of pneumonia.  I suspect you have an asthma exacerbation.  Please start albuterol every 4-6 hours as needed for shortness of breath and coughing fits.  Start Symbicort twice daily.  Rinse your mouth following use of this medication to prevent thrush.  Take prednisone 40 mg for 5 days.  Do not take NSAIDs with this medication including aspirin, ibuprofen/Advil, naproxen/Aleve.  Use Tylenol, Mucinex, Flonase for additional symptom relief.  If your symptoms are not improving or if anything worsens and you have ongoing cough, shortness of breath, chest tightness, chest pain you need to go to the ER.     ED Prescriptions     Medication Sig Dispense Auth. Provider   albuterol (PROVENTIL) (2.5 MG/3ML) 0.083% nebulizer solution Take 3 mLs (2.5 mg total) by nebulization every 6 (six) hours as needed for wheezing or shortness of breath. 75 mL Chera Slivka K, PA-C   albuterol (VENTOLIN HFA) 108 (90 Base) MCG/ACT inhaler Inhale 1-2 puffs into the lungs every 6 (six) hours as needed for wheezing or shortness of breath. 18 g Marqui Formby K, PA-C   budesonide-formoterol (SYMBICORT) 80-4.5 MCG/ACT inhaler Inhale 2 puffs into the lungs in the morning and at bedtime. 1 each Javius Sylla K, PA-C   predniSONE (DELTASONE) 20 MG tablet Take 2 tablets (40 mg total) by mouth daily for 5 days. 10 tablet Tomasita Beevers, Noberto Retort, PA-C      PDMP not reviewed this encounter.   Jeani Hawking, PA-C 07/16/23 1501

## 2023-07-16 NOTE — Discharge Instructions (Signed)
Your chest x-ray was normal with no evidence of pneumonia.  I suspect you have an asthma exacerbation.  Please start albuterol every 4-6 hours as needed for shortness of breath and coughing fits.  Start Symbicort twice daily.  Rinse your mouth following use of this medication to prevent thrush.  Take prednisone 40 mg for 5 days.  Do not take NSAIDs with this medication including aspirin, ibuprofen/Advil, naproxen/Aleve.  Use Tylenol, Mucinex, Flonase for additional symptom relief.  If your symptoms are not improving or if anything worsens and you have ongoing cough, shortness of breath, chest tightness, chest pain you need to go to the ER.

## 2023-07-16 NOTE — ED Triage Notes (Signed)
"  This started with a hard time breathing, cough, runny nose on Monday". "I continue to have those symptoms and body aches at times with ha on/off". "Hard to breath at night, when I wake up I feel like I am drowning". History of Asthma.

## 2023-11-05 ENCOUNTER — Other Ambulatory Visit: Payer: Self-pay

## 2023-11-05 ENCOUNTER — Encounter (HOSPITAL_BASED_OUTPATIENT_CLINIC_OR_DEPARTMENT_OTHER): Payer: Self-pay | Admitting: *Deleted

## 2023-11-05 ENCOUNTER — Emergency Department (HOSPITAL_BASED_OUTPATIENT_CLINIC_OR_DEPARTMENT_OTHER)
Admission: EM | Admit: 2023-11-05 | Discharge: 2023-11-05 | Disposition: A | Discharge status: Home / Self Care | Payer: Self-pay | Attending: Emergency Medicine | Admitting: Emergency Medicine

## 2023-11-05 DIAGNOSIS — D649 Anemia, unspecified: Secondary | ICD-10-CM | POA: Insufficient documentation

## 2023-11-05 DIAGNOSIS — N921 Excessive and frequent menstruation with irregular cycle: Secondary | ICD-10-CM | POA: Insufficient documentation

## 2023-11-05 LAB — CBC
HCT: 35.6 % — ABNORMAL LOW (ref 36.0–46.0)
Hemoglobin: 11.7 g/dL — ABNORMAL LOW (ref 12.0–15.0)
MCH: 32.2 pg (ref 26.0–34.0)
MCHC: 32.9 g/dL (ref 30.0–36.0)
MCV: 98.1 fL (ref 80.0–100.0)
Platelets: 259 10*3/uL (ref 150–400)
RBC: 3.63 MIL/uL — ABNORMAL LOW (ref 3.87–5.11)
RDW: 12.5 % (ref 11.5–15.5)
WBC: 8.3 10*3/uL (ref 4.0–10.5)
nRBC: 0 % (ref 0.0–0.2)

## 2023-11-05 LAB — HCG, QUANTITATIVE, PREGNANCY: hCG, Beta Chain, Quant, S: <1 m[IU]/mL (ref <5)

## 2023-11-05 MED ORDER — MEDROXYPROGESTERONE ACETATE 5 MG PO TABS: 10.0000 mg | ORAL_TABLET | Freq: Three times a day (TID) | ORAL | 0 refills | Status: AC

## 2023-11-05 NOTE — ED Provider Notes (Signed)
 [place] EMERGENCY DEPARTMENT AT [facility] Provider Note   CSN: [id number] Arrival date & time: 11/05/23  2053     History  Chief Complaint  Patient presents with   Vaginal Bleeding    Diane Mann is a 23 y.o. female who presents with concern for heavy menstrual cycle that started 3 days ago.  States this is the first menstrual cycle she has had since coming off of her birth control medication.  She is going through about 1 pad every 30 minutes.  She reports feeling occasionally lightheaded and more cold than normal.  She also reports some swelling in the bilateral legs that has been ongoing for months, no acute worsening.  Denies any injuries to her legs.   Vaginal Bleeding      Home Medications Prior to Admission medications   Medication Sig Start Date End Date Taking? Authorizing Provider  medroxyPROGESTERone  (PROVERA ) 5 MG tablet Take 2 tablets (10 mg total) by mouth 3 (three) times daily for 5 days. 11/05/23 11/10/23 Yes Rexie Catena, PA-C  acetaminophen  (TYLENOL ) 500 MG tablet Take 500 mg by mouth every 8 (eight) hours as needed. 09/13/18   [provider]  albuterol  (PROVENTIL ) (2.5 MG/3ML) 0.083% nebulizer solution Take 3 mLs (2.5 mg total) by nebulization every 6 (six) hours as needed for wheezing or shortness of breath. 07/16/23   Raspet, Erin K, PA-C  albuterol  (VENTOLIN  HFA) 108 (90 Base) MCG/ACT inhaler Inhale 1-2 puffs into the lungs every 6 (six) hours as needed for wheezing or shortness of breath. 07/16/23   Raspet, Erin K, PA-C  budesonide -formoterol  (SYMBICORT ) 80-4.5 MCG/ACT inhaler Inhale 2 puffs into the lungs in the morning and at bedtime. 07/16/23   Raspet, Erin K, PA-C  ibuprofen  (ADVIL ) 800 MG tablet Take 800 mg by mouth every 8 (eight) hours as needed.    [provider]  metFORMIN  (GLUCOPHAGE ) 500 MG tablet Take 500 mg by mouth daily with breakfast. 08/15/17   [provider]  citalopram  (CELEXA ) 10 MG tablet Take  1 tablet (10 mg total) by mouth daily. For mood 08/25/18 03/05/19  Rochell Chroman, NP  famotidine  (PEPCID ) 20 MG tablet Take 1 tablet (20 mg total) by mouth 2 (two) times daily. Patient not taking: Reported on 11/06/2019 10/01/19 05/26/20  Darr, Jacob, PA-C  zolpidem  (AMBIEN ) 5 MG tablet Take 1 tablet (5 mg total) by mouth at bedtime as needed for sleep. 08/24/18 03/05/19  Rochell Chroman, NP      Allergies    Patient has no known allergies.    Review of Systems   Review of Systems  Genitourinary:  Positive for vaginal bleeding.    Physical Exam Updated Vital Signs BP 113/77   Pulse 83   Temp 98.1 F (36.7 C)   Resp 18   Ht 5\' 1"  (1.549 m)   Wt 94.1 kg   LMP 11/05/2023   SpO2 98%   BMI 39.20 kg/m  Physical Exam Vitals and nursing note reviewed. Exam conducted with a chaperone present.  Constitutional:      Appearance: Normal appearance.  HENT:     Head: Atraumatic.  Cardiovascular:     Rate and Rhythm: Normal rate and regular rhythm.     Comments: 2+ dorsalis pedis pulse Pulmonary:     Effort: Pulmonary effort is normal.  Abdominal:     Comments: Abdomen is soft and nontender to palpation  Genitourinary:    Comments: RN Aryana present to chaperone pelvic exam  Patient with moderate  amount of blood in the vaginal vault.  Vaginal canal and cervix healthy appearing.   Musculoskeletal:     Comments: No lower extremity edema bilaterally No calf tenderness to palpation bilaterally or swelling of the calves. No overlying skin changes of the bilateral lower extremities  Able to flex and extend at the knees bilaterally, able to plantarflex and dorsiflex at the ankles bilaterally  Neurological:     General: No focal deficit present.     Mental Status: She is alert.  Psychiatric:        Mood and Affect: Mood normal.        Behavior: Behavior normal.     ED Results / Procedures / Treatments   Labs (all labs ordered are listed, but only abnormal results are displayed) Labs  Reviewed  CBC - Abnormal; Notable for the following components:      Result Value   RBC 3.63 (*)    Hemoglobin 11.7 (*)    HCT 35.6 (*)    All other components within normal limits  HCG, QUANTITATIVE, PREGNANCY    EKG None  Radiology No results found.  Procedures Procedures    Medications Ordered in ED Medications - No data to display  ED Course/ Medical Decision Making/ A&P                                 Medical Decision Making Amount and/or Complexity of Data Reviewed Labs: ordered.  Risk Prescription drug management.     Differential diagnosis includes but is not limited to menstrual period, menorrhagia, leiomyoma, blood loss anemia, DVT, venous insufficiency  ED Course:  Upon initial evaluation, patient is well-appearing, stable vital signs.  Pelvic exam performed with RN chaperone present, moderate amount of blood in the vaginal vault but cervix and vaginal canal healthy appearing.  Her CBC is with a stable hemoglobin, appears to be at baseline.  No indication for blood transfusion at this time.  We discussed that we can start Provera  to lessen the bleeding as going through a pad an hour is a very heavy flow.  Will have her follow-up with her PCP or gynecologist within the next month for further management. She does note some swelling in her legs she feels like has been there for the past couple months.  No acute worsening.  On exam I do not appreciate any swelling in the lower extremities.  Neurovascular intact in the lower extremities.  No unilateral leg swelling or calf tenderness to palpation, no concern for DVT.  No overlying skin changes to suggest infection.  This could be a vascular insufficiency, I recommended she continue to monitor this with her PCP.  Patient stable and appropriate for discharge home this time  Labs Ordered: I Ordered, and personally interpreted labs.  The pertinent results include:   CBC with slightly low hemoglobin at 11.7, this  appears to be at baseline past couple of years Pregnancy test negative     Impression: Menorrhagia  Disposition:  The patient was discharged home with instructions to take Provera  as prescribed.  Follow-up with her PCP or gynecologist within the next month.  I did provide contact information for a gynecologist as she said she has not seen her gynecologist in many years. Return precautions given.    Record Review: External records from outside source obtained and reviewed including previous CBCs with hemoglobin on the 11's     This chart was dictated using  voice recognition software, Nurse, children's. Despite the best efforts of this provider to proofread and correct errors, errors may still occur which can change documentation meaning.          Final Clinical Impression(s) / ED Diagnoses Final diagnoses:  Menorrhagia with irregular cycle    Rx / DC Orders ED Discharge Orders          Ordered    medroxyPROGESTERone  (PROVERA ) 5 MG tablet  3 times daily        11/05/23 2232              Rexie Catena, PA-C 11/05/23 2251    Dalene Duck, MD 11/05/23 [id number]

## 2023-11-05 NOTE — ED Triage Notes (Addendum)
 Patient to ED POV reporting heavy menstrual bleeding since Thursday with clots. At onset of bleeding patient felt dizziness initially with intermittent lightheadedness.   Patient reporting she is soaking a pad every 30 minutes.     Also reporting 8/10 bilateral leg pain without swelling or injury

## 2023-11-05 NOTE — ED Notes (Signed)
 RN reviewed discharge instructions with pt. Pt verbalized understanding and had no further questions

## 2023-11-05 NOTE — Discharge Instructions (Addendum)
 Your hemoglobin which is one your blood counts is slightly low here today at 11.7 (normal between 12 and 15).  This means you have an anemia.  However, it appears to be consistent with other lab draws over the past couple of years.  Your pregnancy test is negative.  You have been prescribed a medication called Provera  to help with your heavy menstrual bleeding.  Please take this as prescribed.  Please follow-up with your PCP or gynecologist within the next month for further monitoring of your menstrual cycles and monitoring of your hemoglobin.  I have included contact information for gynecology below, please contact them or a gynecologist of your choice to set up an appointment.

## 2024-01-01 IMAGING — US US ABDOMEN LIMITED
1 series · 15 of 25 positions shown · non-contrast
Comparison: None.

CLINICAL DATA: Right upper quadrant pain

EXAM:
ULTRASOUND ABDOMEN LIMITED RIGHT UPPER QUADRANT

[Series 1: us abdomen limited mc & wl · 15 of 49 slices shown]
[im 1/49]
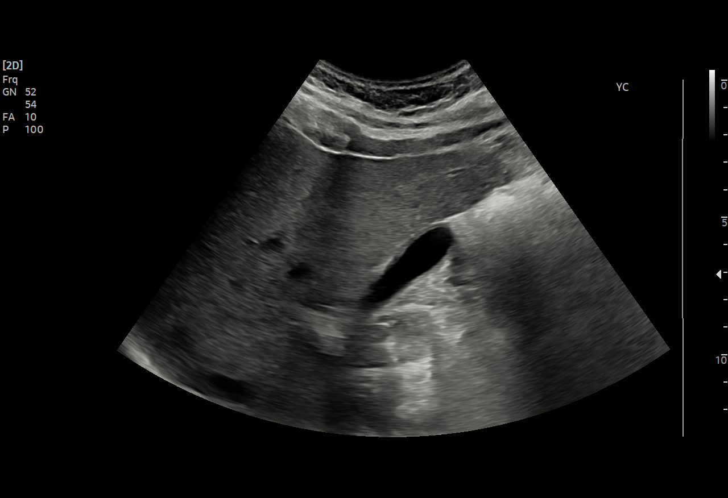
[im 5/49]
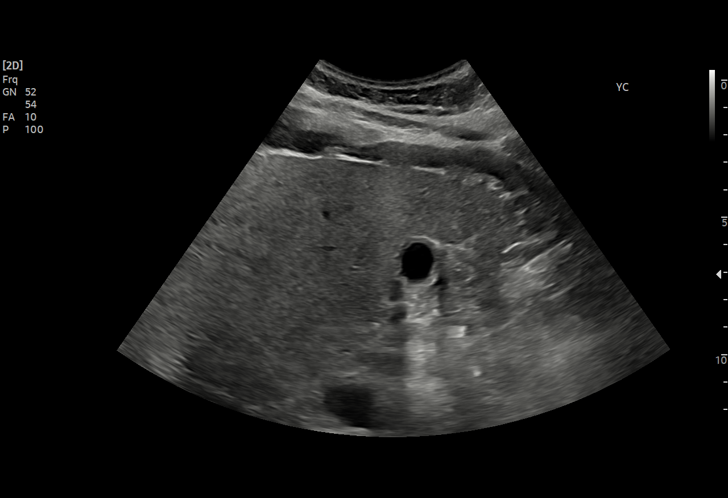
[im 9/49]
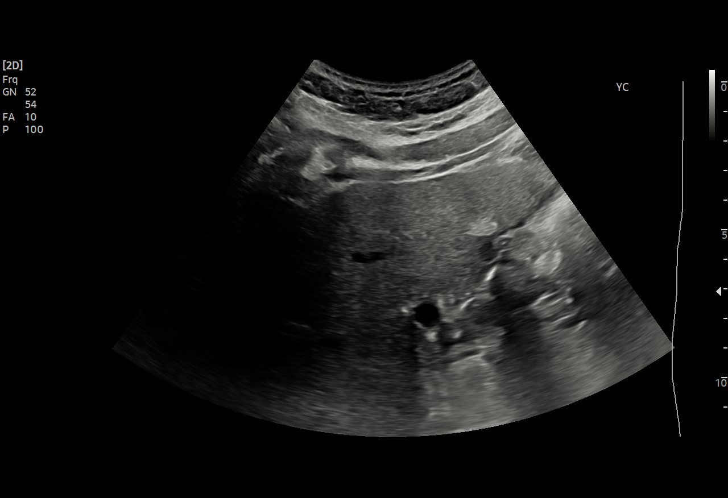
[im 11/49]
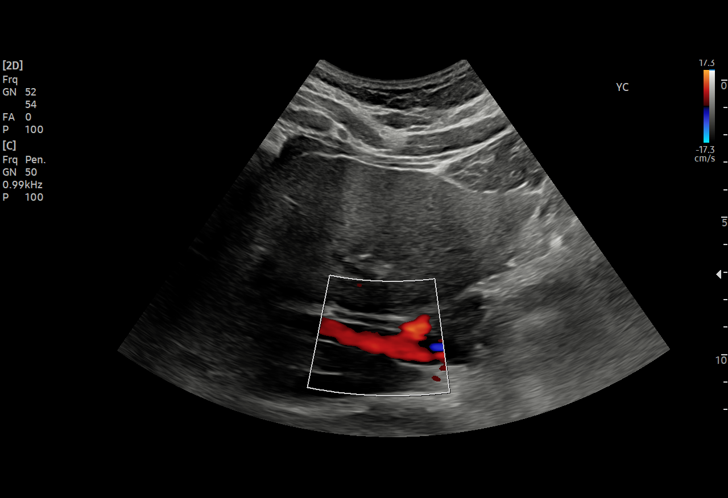
[im 15/49]
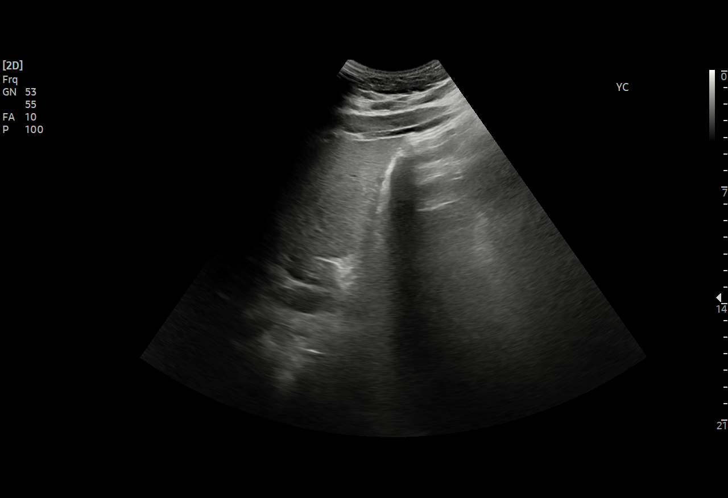
[im 19/49]
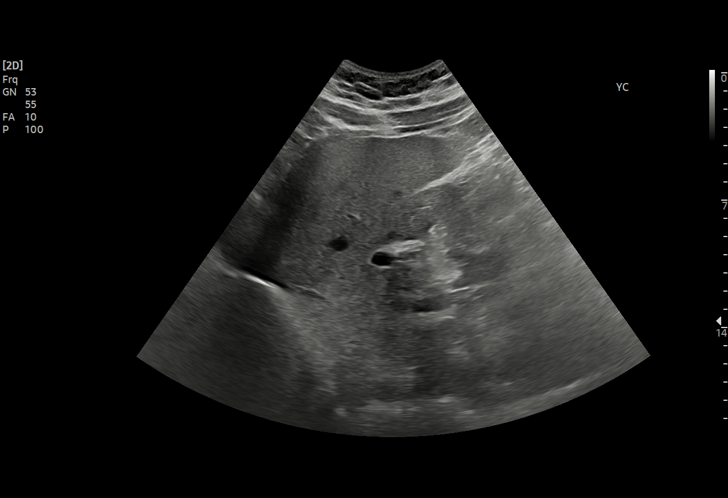
[im 21/49]
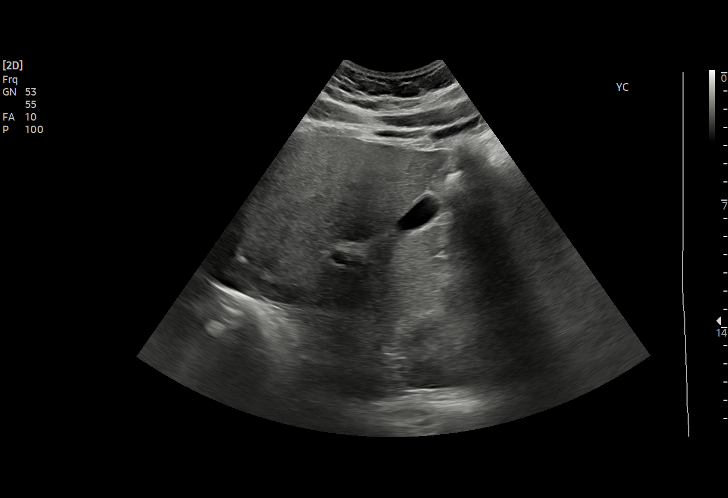
[im 25/49]
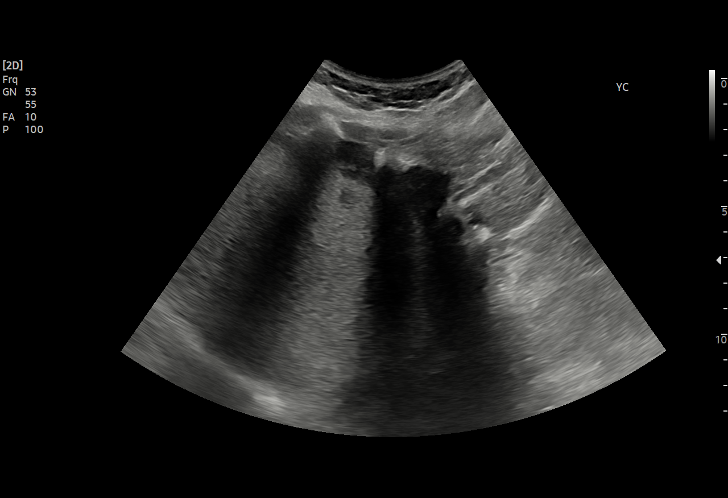
[im 29/49]
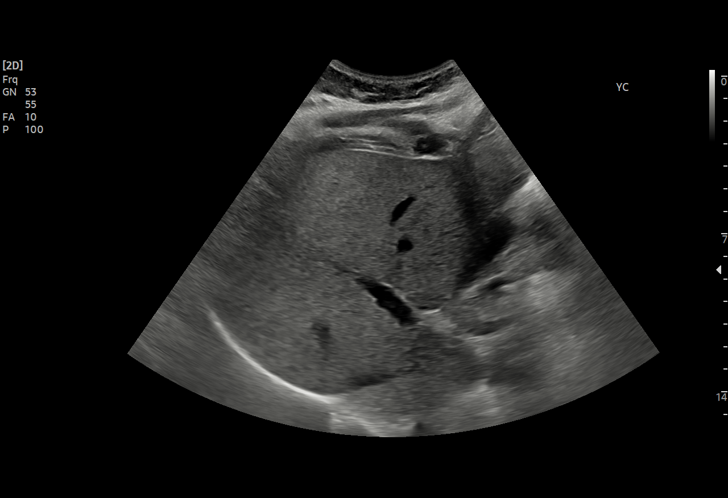
[im 31/49]
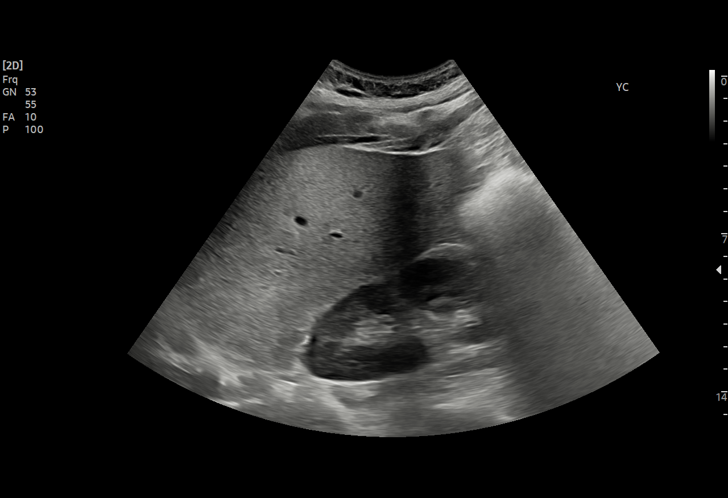
[im 35/49]
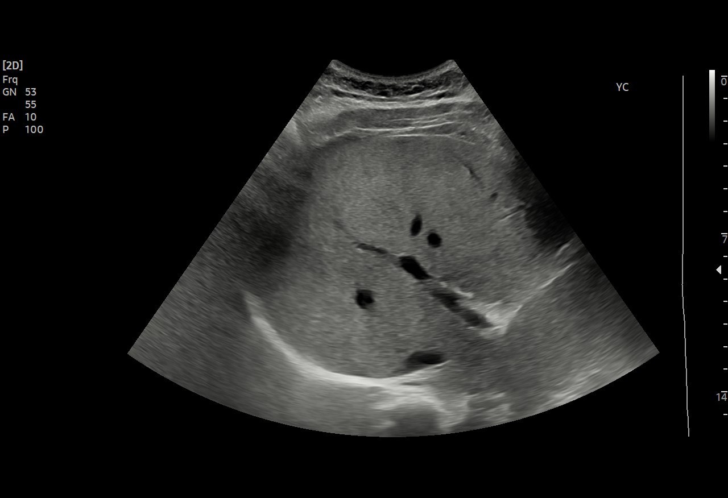
[im 39/49]
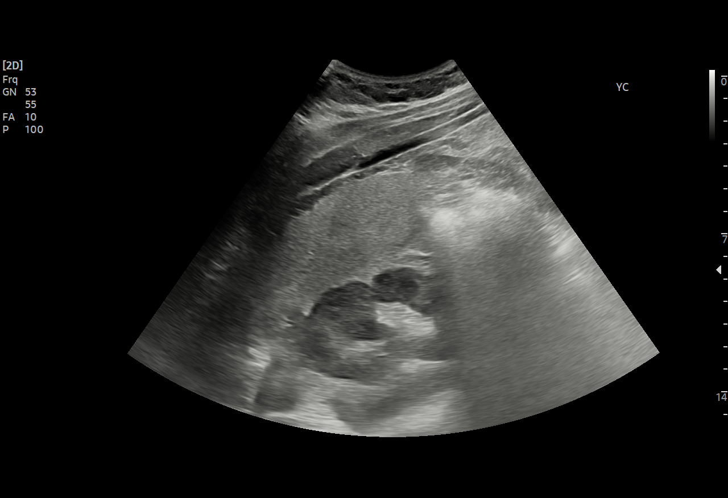
[im 41/49]
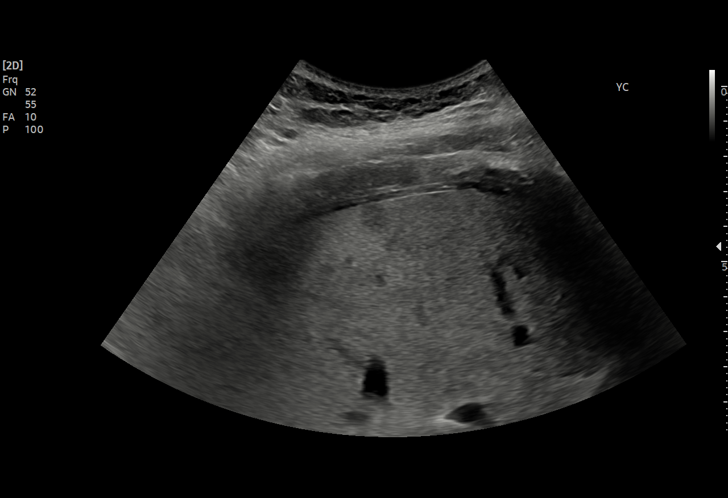
[im 45/49]
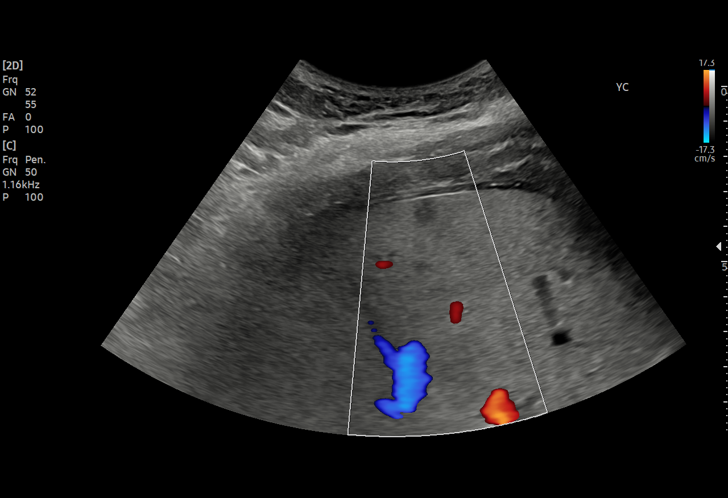
[im 49/49]
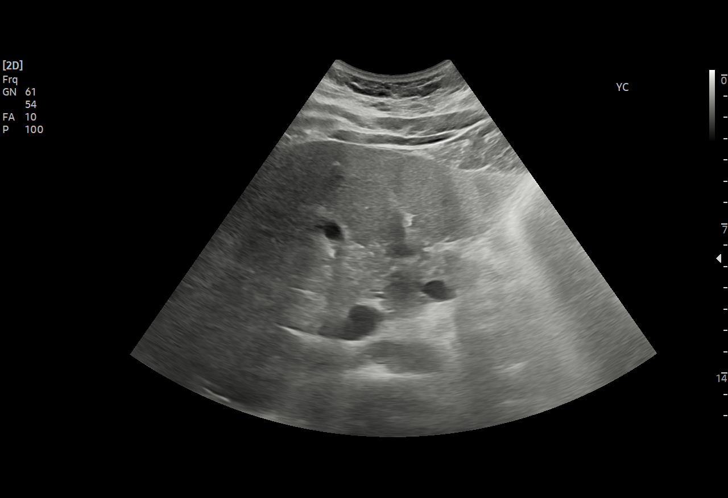

[15 of 25 positions shown; findings below may reference images not displayed]

FINDINGS: Gallbladder:

No gallstones or wall thickening visualized. No sonographic Murphy
sign noted by sonographer.

Common bile duct:

Diameter: Normal caliber, 3 mm

Liver:

Increased echotexture compatible with fatty infiltration. No focal
abnormality or biliary ductal dilatation. Hypoechoic area anteriorly
measuring 8 mm could reflect small nodule or focal fatty sparing.
Portal vein is patent on color Doppler imaging with normal direction
of blood flow towards the liver.

Other: None.
IMPRESSION: Fatty infiltration of the liver.

No acute findings.
